# Patient Record
Sex: Male | Born: 1955
Health system: Southern US, Community
[De-identification: ages and names within clinical notes are randomized; demographics above are authoritative.]

## PROBLEM LIST (undated history)

## (undated) DIAGNOSIS — E559 Vitamin D deficiency, unspecified: Secondary | ICD-10-CM

## (undated) DIAGNOSIS — R7303 Prediabetes: Secondary | ICD-10-CM

## (undated) DIAGNOSIS — K2270 Barrett's esophagus without dysplasia: Secondary | ICD-10-CM

## (undated) DIAGNOSIS — D72819 Decreased white blood cell count, unspecified: Secondary | ICD-10-CM

## (undated) DIAGNOSIS — U071 COVID-19: Secondary | ICD-10-CM

## (undated) DIAGNOSIS — J301 Allergic rhinitis due to pollen: Secondary | ICD-10-CM

## (undated) DIAGNOSIS — M1990 Unspecified osteoarthritis, unspecified site: Secondary | ICD-10-CM

## (undated) DIAGNOSIS — K219 Gastro-esophageal reflux disease without esophagitis: Secondary | ICD-10-CM

## (undated) DIAGNOSIS — K649 Unspecified hemorrhoids: Secondary | ICD-10-CM

## (undated) DIAGNOSIS — J45909 Unspecified asthma, uncomplicated: Secondary | ICD-10-CM

## (undated) DIAGNOSIS — F439 Reaction to severe stress, unspecified: Secondary | ICD-10-CM

## (undated) DIAGNOSIS — E7800 Pure hypercholesterolemia, unspecified: Secondary | ICD-10-CM

## (undated) DIAGNOSIS — E785 Hyperlipidemia, unspecified: Secondary | ICD-10-CM

## (undated) HISTORY — DX: Unspecified osteoarthritis, unspecified site: M19.90

## (undated) HISTORY — PX: TOTAL HIP ARTHROPLASTY: SHX124

## (undated) HISTORY — DX: Hyperlipidemia, unspecified: E78.5

## (undated) HISTORY — DX: Barrett's esophagus without dysplasia: K22.70

## (undated) HISTORY — DX: Unspecified hemorrhoids: K64.9

## (undated) HISTORY — DX: Pure hypercholesterolemia, unspecified: E78.00

## (undated) HISTORY — DX: COVID-19: U07.1

## (undated) HISTORY — DX: Hypercalcemia: E83.52

## (undated) HISTORY — DX: Decreased white blood cell count, unspecified: D72.819

## (undated) HISTORY — DX: Allergic rhinitis due to pollen: J30.1

## (undated) HISTORY — DX: Unspecified asthma, uncomplicated: J45.909

## (undated) HISTORY — DX: Gastro-esophageal reflux disease without esophagitis: K21.9

## (undated) HISTORY — DX: Vitamin D deficiency, unspecified: E55.9

## (undated) HISTORY — DX: Prediabetes: R73.03

## (undated) HISTORY — DX: Reaction to severe stress, unspecified: F43.9

---

## 2003-08-04 ENCOUNTER — Ambulatory Visit (HOSPITAL_COMMUNITY): Admission: RE | Admit: 2003-08-04 | Discharge: 2003-08-04 | Payer: Self-pay | Admitting: Family Medicine

## 2003-12-16 ENCOUNTER — Ambulatory Visit (HOSPITAL_COMMUNITY): Admission: RE | Admit: 2003-12-16 | Discharge: 2003-12-16 | Payer: Self-pay | Admitting: Gastroenterology

## 2013-12-03 ENCOUNTER — Other Ambulatory Visit: Payer: Self-pay | Admitting: *Deleted

## 2013-12-03 DIAGNOSIS — R202 Paresthesia of skin: Secondary | ICD-10-CM

## 2014-01-22 ENCOUNTER — Encounter: Payer: Self-pay | Admitting: Neurology

## 2014-01-22 ENCOUNTER — Telehealth: Payer: Self-pay | Admitting: Neurology

## 2014-01-22 NOTE — Telephone Encounter (Signed)
Pt called at 11:29AM to cancel his 60mins EMG for today 01/22/14. Pt a;ready had his EMG done somewhere else. Dr. Swayne/referring provider was notified.

## 2014-01-23 NOTE — Telephone Encounter (Signed)
appt marked as a no show b/c pt did not allow 24 hours prior notification but a no show letter was not sent / Sherri S.

## 2015-01-04 DIAGNOSIS — M169 Osteoarthritis of hip, unspecified: Secondary | ICD-10-CM | POA: Insufficient documentation

## 2018-10-14 DIAGNOSIS — M1712 Unilateral primary osteoarthritis, left knee: Secondary | ICD-10-CM | POA: Insufficient documentation

## 2018-11-11 ENCOUNTER — Other Ambulatory Visit: Payer: Self-pay

## 2018-11-11 DIAGNOSIS — Z20822 Contact with and (suspected) exposure to covid-19: Secondary | ICD-10-CM

## 2018-11-13 LAB — NOVEL CORONAVIRUS, NAA: SARS-CoV-2, NAA: DETECTED — AB

## 2020-01-19 DIAGNOSIS — E559 Vitamin D deficiency, unspecified: Secondary | ICD-10-CM | POA: Diagnosis not present

## 2020-01-19 DIAGNOSIS — E782 Mixed hyperlipidemia: Secondary | ICD-10-CM | POA: Diagnosis not present

## 2020-01-19 DIAGNOSIS — Z0000 Encounter for general adult medical examination without abnormal findings: Secondary | ICD-10-CM | POA: Diagnosis not present

## 2020-01-19 DIAGNOSIS — K2270 Barrett's esophagus without dysplasia: Secondary | ICD-10-CM | POA: Diagnosis not present

## 2020-01-19 DIAGNOSIS — Z125 Encounter for screening for malignant neoplasm of prostate: Secondary | ICD-10-CM | POA: Diagnosis not present

## 2020-01-19 DIAGNOSIS — M62838 Other muscle spasm: Secondary | ICD-10-CM | POA: Diagnosis not present

## 2020-01-19 DIAGNOSIS — Z23 Encounter for immunization: Secondary | ICD-10-CM | POA: Diagnosis not present

## 2020-01-19 DIAGNOSIS — R7309 Other abnormal glucose: Secondary | ICD-10-CM | POA: Diagnosis not present

## 2020-01-19 DIAGNOSIS — M170 Bilateral primary osteoarthritis of knee: Secondary | ICD-10-CM | POA: Diagnosis not present

## 2020-01-19 DIAGNOSIS — Z136 Encounter for screening for cardiovascular disorders: Secondary | ICD-10-CM | POA: Diagnosis not present

## 2020-01-30 DIAGNOSIS — Z136 Encounter for screening for cardiovascular disorders: Secondary | ICD-10-CM | POA: Diagnosis not present

## 2020-01-30 DIAGNOSIS — Z87891 Personal history of nicotine dependence: Secondary | ICD-10-CM | POA: Diagnosis not present

## 2020-02-05 ENCOUNTER — Encounter: Payer: Self-pay | Admitting: Podiatry

## 2020-02-05 ENCOUNTER — Ambulatory Visit: Payer: Medicare HMO | Admitting: Podiatry

## 2020-02-05 ENCOUNTER — Other Ambulatory Visit: Payer: Self-pay

## 2020-02-05 DIAGNOSIS — L601 Onycholysis: Secondary | ICD-10-CM | POA: Diagnosis not present

## 2020-02-05 DIAGNOSIS — B351 Tinea unguium: Secondary | ICD-10-CM | POA: Diagnosis not present

## 2020-02-05 DIAGNOSIS — L603 Nail dystrophy: Secondary | ICD-10-CM | POA: Diagnosis not present

## 2020-02-05 DIAGNOSIS — Z1211 Encounter for screening for malignant neoplasm of colon: Secondary | ICD-10-CM | POA: Insufficient documentation

## 2020-02-06 NOTE — Progress Notes (Signed)
  Subjective:  Patient ID: Andrew Powell, male    DOB: 04/14/1955,  MRN: [id number] HPI Chief Complaint  Patient presents with  . Nail Problem    Toenails - 1st and 2nd bilateral - thick and discolored, 5th toe right - medially-dug a splinter out of toe months ago and now irritiated  . New Patient (Initial Visit)    65 y.o. male presents with the above complaint.   ROS: Denies fever chills nausea vomiting muscle aches pains calf pain back pain chest pain shortness of breath.  No past medical history on file.   Current Outpatient Medications:  .  Cholecalciferol 125 MCG (5000 UT) capsule, Take by mouth., Disp: , Rfl:  .  pantoprazole (PROTONIX) 40 MG tablet, Take 40 mg by mouth daily., Disp: , Rfl:  .  tiZANidine (ZANAFLEX) 4 MG tablet, Take 4 mg by mouth at bedtime., Disp: , Rfl:   No Known Allergies Review of Systems Objective:  There were no vitals filed for this visit.  General: Well developed, nourished, in no acute distress, alert and oriented x3   Dermatological: Skin is warm, dry and supple bilateral. Nails x 10 are well maintained; remaining integument appears unremarkable at this time. There are no open sores, no preulcerative lesions, no rash or signs of infection present.  Vascular: Dorsalis Pedis artery and Posterior Tibial artery pedal pulses are 2/4 bilateral with immedate capillary fill time. Pedal hair growth present. No varicosities and no lower extremity edema present bilateral.   Neruologic: Grossly intact via light touch bilateral. Vibratory intact via tuning fork bilateral. Protective threshold with Semmes Wienstein monofilament intact to all pedal sites bilateral. Patellar and Achilles deep tendon reflexes 2+ bilateral. No Babinski or clonus noted bilateral.   Musculoskeletal: No gross boney pedal deformities bilateral. No pain, crepitus, or limitation noted with foot and ankle range of motion bilateral. Muscular strength 5/5 in all groups tested  bilateral.  Gait: Unassisted, Nonantalgic.    Radiographs:  None taken  Assessment & Plan:   Assessment: Nail dystrophy and hammertoe deformity hallux and second digits bilaterally.  Plan: Discussed etiology pathology conservative surgical therapies at this point I took samples of the skin and nails to be sent for pathologic evaluation we will follow-up with him in 1 month     Torri Langston T. [place], [place]

## 2020-03-02 DIAGNOSIS — Z96652 Presence of left artificial knee joint: Secondary | ICD-10-CM | POA: Diagnosis not present

## 2020-03-02 DIAGNOSIS — M25462 Effusion, left knee: Secondary | ICD-10-CM | POA: Diagnosis not present

## 2020-03-03 DIAGNOSIS — Z96652 Presence of left artificial knee joint: Secondary | ICD-10-CM | POA: Diagnosis not present

## 2020-03-09 ENCOUNTER — Ambulatory Visit: Payer: Medicare HMO | Admitting: Podiatry

## 2020-03-09 DIAGNOSIS — M25462 Effusion, left knee: Secondary | ICD-10-CM | POA: Diagnosis not present

## 2020-03-11 ENCOUNTER — Ambulatory Visit: Payer: Medicare HMO | Admitting: Podiatry

## 2020-03-11 ENCOUNTER — Other Ambulatory Visit: Payer: Self-pay

## 2020-03-11 DIAGNOSIS — L603 Nail dystrophy: Secondary | ICD-10-CM

## 2020-03-11 MED ORDER — ITRACONAZOLE 100 MG PO CAPS: ORAL_CAPSULE | ORAL | 0 refills | Status: DC

## 2020-03-11 NOTE — Progress Notes (Signed)
He presents today for follow-up of his some nail fungus results.  Objective: Vital signs are stable alert oriented x3 no change in physical exam pathology report does demonstrate dermatophyte and yeast components have been isolated.  Assessment: Onychomycosis.  Plan: At this point itraconazole would be best utilized for the 93 to 100% eradication of this infection.  Associated with this we did discuss the possible side effects which he understands and is amenable to.  We are requesting a liver profile CMP and AST and ALT.  On final follow-up with him in 2 months for another AST and ALT.  Should his blood work currently come back abnormal I will notify him.

## 2020-03-11 NOTE — Patient Instructions (Signed)
Itraconazole capsules and tablets What is this medicine? ITRACONAZOLE (i tra KO na zole) is an antifungal medicine. It is used to treat certain kinds of fungal or yeast infections. This medicine may be used for other purposes; ask your health care provider or pharmacist if you have questions. COMMON BRAND NAME(S): ONMEL, Sporanox, TOLSURA What should I tell my health care provider before I take this medicine? They need to know if you have any of these conditions:  heart disease  history of irregular heartbeat  immune system problems  kidney disease  liver disease  lung or breathing disease  an unusual or allergic reaction to itraconazole, or other antifungal medicines, foods, dyes or preservatives  pregnant or trying to get pregnant  breast-feeding How should I use this medicine? Take this medicine by mouth with a full glass of water. Follow the directions on the prescription label. Take this medicine with food. Avoid taking antacids, H2-blockers, or proton pump inhibitors within 2 hours of taking this medicine. It is best to separate these medicines by 2 hours. Take your medicine at regular intervals. Do not take your medicine more often than directed. Take all of your medicine as directed even if you think you are better. Do not skip doses or stop your medicine early. Talk to your pediatrician regarding the use of this medicine in children. Special care may be needed. Overdosage: If you think you have taken too much of this medicine contact a poison control center or emergency room at once. NOTE: This medicine is only for you. Do not share this medicine with others. What if I miss a dose? If you miss a dose, take it as soon as you can. If it is almost time for your next dose, take only that dose. Do not take double or extra doses. What may interact with this medicine? Do not take this medicine with any of the following medications:  alfuzosin  alprazolam  avanafil  certain  medicines for blood pressure like felodipine, nisoldipine  certain medicines for cholesterol like cerivastatin, lovastatin, simvastatin, lomitapide  certain medicines for the heart like disopyramide, dofetilide, dronedarone, eplerenone, ivabradine, quinidine, ranolazine  cisapride  colchicine (if you have liver or kidney problems)  conivaptan  ergot alkaloids like dihydroergotamine, ergonovine, ergotamine, methylergonovine  irinotecan  isavuconazole  lurasidone  methadone  midazolam  naloxegol  nevirapine  other medicines that prolong the QT interval (cause an abnormal heart rhythm)  pimozide  red yeast rice  sirolimus  thioridazine  ticagrelor  triazolam This medicine may also interact with the following medications:  aliskiren  amlodipine  antacids  antiviral medicines for HIV or AIDS  aprepitant  atorvastatin  buprenorphine  certain antibiotics like ciprofloxacin, clarithromycin, erythromycin  certain medicines for bladder problems like fesoterodine, solifenacin, tolterodine  certain medicines for cancer like axitinib, bortezomib, busulfan, dabrafenib, dasatinib, docetaxel, erlotinib, gefitinib, ibrutinib, imatinib, ixabepilone, lapatinib, nilotinib, ponatinib, sunitinib, trabectedin, trimetrexate, vinca alkaloids  certain medicines for depression, anxiety, or psychotic disturbances like aripiprazole, buspirone, diazepam, haloperidol, perospirone, quetiapine, risperidone  certain medicines for erectile dysfunction like vardenafil, sildenafil, tadalafil  certain medicines for pain like alfentanil, fentanyl, oxycodone, sufentanil  certain medicines for stomach problems like cimetidine, famotidine, omeprazole, lansoprazole  certain medicines that treat or prevent blood clots like warfarin, dabigatran, rivaroxaban  certain medicines for seizures like carbamazepine, phenytoin  certain medicines for tuberculosis like isoniazid, INH, rifabutin,  rifampin, rifapentine  cilostazol  cinacalcet  cyclosporine  digoxin  eletriptan  everolimus  halofantrine  isradipine  meloxicam  nadolol  nifedipine  other medicines for fungal infections  praziquantel  ramelteon  repaglinide  salmeterol  saxagliptin  steroid medicines like budesonide, ciclesonide, dexamethasone, fluticasone, methylprednisolone  tacrolimus  tamsulosin  tolvaptan  verapamil  ziprasidone This list may not describe all possible interactions. Give your health care provider a list of all the medicines, herbs, non-prescription drugs, or dietary supplements you use. Also tell them if you smoke, drink alcohol, or use illegal drugs. Some items may interact with your medicine. What should I watch for while using this medicine? Tell your doctor or healthcare professional if your symptoms do not start to get better or if they get worse. You may get drowsy or dizzy. Do not drive, use machinery, or do anything that needs mental alertness until you know how the medicine affects you. Do not stand or sit up quickly, especially if you are an older patient. This reduces the risk of dizzy or fainting spells. What side effects may I notice from receiving this medicine? Side effects that you should report to your doctor or health care professional as soon as possible:  allergic reactions like skin rash, itching or hives, swelling of the face, lips, or tongue  changes in hearing  pain, tingling, numbness in the hands or feet  signs and symptoms of heart failure like breathing problems; fast, irregular heartbeat; sudden weight gain; swelling of the ankles, feet; unusually weak or tired  signs and symptoms of liver injury like dark yellow or brown urine; general ill feeling or flu-like symptoms; light-colored stools; loss of appetite; right upper belly pain; yellowing of the eyes or skin Side effects that usually do not require medical attention (report to  your doctor or health care professional if they continue or are bothersome):  blurred vision  diarrhea  dizziness  headache  nausea, vomiting This list may not describe all possible side effects. Call your doctor for medical advice about side effects. You may report side effects to FDA at 1-800-FDA-1088. Where should I keep my medicine? Keep out of the reach of children. Store at room temperature between 15 and 25 degrees C (59 and 77 degrees F). Protect from light and moisture. Throw away any unused medicine after the expiration date. NOTE: This sheet is a summary. It may not cover all possible information. If you have questions about this medicine, talk to your doctor, pharmacist, or health care provider.  2021 Elsevier/Gold Standard (2017-12-17 13:38:38)

## 2020-03-30 DIAGNOSIS — T8484XD Pain due to internal orthopedic prosthetic devices, implants and grafts, subsequent encounter: Secondary | ICD-10-CM | POA: Diagnosis not present

## 2020-03-30 DIAGNOSIS — Z96652 Presence of left artificial knee joint: Secondary | ICD-10-CM | POA: Diagnosis not present

## 2020-03-31 ENCOUNTER — Telehealth: Payer: Self-pay | Admitting: *Deleted

## 2020-03-31 NOTE — Telephone Encounter (Signed)
L/M for patient to call back regarding itraconazole prescription

## 2020-04-01 ENCOUNTER — Telehealth: Payer: Self-pay | Admitting: Podiatry

## 2020-04-01 MED ORDER — ITRACONAZOLE 100 MG PO CAPS: ORAL_CAPSULE | ORAL | 0 refills | Status: DC

## 2020-04-01 NOTE — Telephone Encounter (Signed)
Called patient -   Sent Rx to [facility] to not file with insurance for discount and GoodRx coupon

## 2020-04-01 NOTE — Telephone Encounter (Signed)
Patient has requested return call, Please Advise 

## 2020-04-01 NOTE — Addendum Note (Signed)
Addended by: Kristian Covey on: 04/01/2020 06:13 PM   Modules accepted: Orders

## 2020-04-02 ENCOUNTER — Other Ambulatory Visit: Payer: Self-pay | Admitting: Podiatry

## 2020-04-02 DIAGNOSIS — L603 Nail dystrophy: Secondary | ICD-10-CM | POA: Diagnosis not present

## 2020-04-03 LAB — COMPREHENSIVE METABOLIC PANEL
ALT: 16 IU/L (ref 0–44)
AST: 20 IU/L (ref 0–40)
Albumin/Globulin Ratio: 1 — ABNORMAL LOW (ref 1.2–2.2)
Albumin: 3.8 g/dL (ref 3.8–4.8)
Alkaline Phosphatase: 118 IU/L (ref 44–121)
BUN/Creatinine Ratio: 28 — ABNORMAL HIGH (ref 10–24)
BUN: 22 mg/dL (ref 8–27)
Bilirubin Total: 0.2 mg/dL (ref 0.0–1.2)
CO2: 24 mmol/L (ref 20–29)
Calcium: 11.3 mg/dL — ABNORMAL HIGH (ref 8.6–10.2)
Chloride: 104 mmol/L (ref 96–106)
Creatinine, Ser: 0.79 mg/dL (ref 0.76–1.27)
Globulin, Total: 3.8 g/dL (ref 1.5–4.5)
Glucose: 101 mg/dL — ABNORMAL HIGH (ref 65–99)
Potassium: 4.6 mmol/L (ref 3.5–5.2)
Sodium: 141 mmol/L (ref 134–144)
Total Protein: 7.6 g/dL (ref 6.0–8.5)
eGFR: 99 mL/min/{1.73_m2} (ref >59)

## 2020-04-05 DIAGNOSIS — Y792 Prosthetic and other implants, materials and accessory orthopedic devices associated with adverse incidents: Secondary | ICD-10-CM | POA: Diagnosis not present

## 2020-04-05 DIAGNOSIS — M25562 Pain in left knee: Secondary | ICD-10-CM | POA: Diagnosis not present

## 2020-04-05 DIAGNOSIS — M25561 Pain in right knee: Secondary | ICD-10-CM | POA: Diagnosis not present

## 2020-04-05 DIAGNOSIS — T8484XD Pain due to internal orthopedic prosthetic devices, implants and grafts, subsequent encounter: Secondary | ICD-10-CM | POA: Diagnosis not present

## 2020-04-05 DIAGNOSIS — Z96652 Presence of left artificial knee joint: Secondary | ICD-10-CM | POA: Diagnosis not present

## 2020-04-05 DIAGNOSIS — Z96643 Presence of artificial hip joint, bilateral: Secondary | ICD-10-CM | POA: Diagnosis not present

## 2020-04-07 ENCOUNTER — Telehealth: Payer: Self-pay

## 2020-04-07 NOTE — Telephone Encounter (Signed)
Patient notified of results via voice mail °

## 2020-04-07 NOTE — Telephone Encounter (Signed)
-----   Message from Elinor Parkinson, [place] sent at 04/07/2020  7:17 AM EDT ----- Blood work looks good and may continue medication.

## 2020-04-12 DIAGNOSIS — T8484XD Pain due to internal orthopedic prosthetic devices, implants and grafts, subsequent encounter: Secondary | ICD-10-CM | POA: Diagnosis not present

## 2020-04-12 DIAGNOSIS — Z96652 Presence of left artificial knee joint: Secondary | ICD-10-CM | POA: Diagnosis not present

## 2020-04-26 DIAGNOSIS — Z96652 Presence of left artificial knee joint: Secondary | ICD-10-CM | POA: Diagnosis not present

## 2020-04-26 DIAGNOSIS — T8484XD Pain due to internal orthopedic prosthetic devices, implants and grafts, subsequent encounter: Secondary | ICD-10-CM | POA: Diagnosis not present

## 2020-05-06 DIAGNOSIS — M25562 Pain in left knee: Secondary | ICD-10-CM | POA: Diagnosis not present

## 2020-05-06 DIAGNOSIS — B351 Tinea unguium: Secondary | ICD-10-CM | POA: Diagnosis not present

## 2020-05-06 DIAGNOSIS — K200 Eosinophilic esophagitis: Secondary | ICD-10-CM | POA: Diagnosis not present

## 2020-05-06 DIAGNOSIS — Z01818 Encounter for other preprocedural examination: Secondary | ICD-10-CM | POA: Diagnosis not present

## 2020-05-06 DIAGNOSIS — R0602 Shortness of breath: Secondary | ICD-10-CM | POA: Diagnosis not present

## 2020-05-06 DIAGNOSIS — Z8616 Personal history of COVID-19: Secondary | ICD-10-CM | POA: Diagnosis not present

## 2020-05-06 DIAGNOSIS — Z01810 Encounter for preprocedural cardiovascular examination: Secondary | ICD-10-CM | POA: Diagnosis not present

## 2020-05-06 DIAGNOSIS — I441 Atrioventricular block, second degree: Secondary | ICD-10-CM | POA: Diagnosis not present

## 2020-05-06 DIAGNOSIS — K219 Gastro-esophageal reflux disease without esophagitis: Secondary | ICD-10-CM | POA: Diagnosis not present

## 2020-05-06 DIAGNOSIS — K2270 Barrett's esophagus without dysplasia: Secondary | ICD-10-CM | POA: Diagnosis not present

## 2020-05-06 DIAGNOSIS — Z01812 Encounter for preprocedural laboratory examination: Secondary | ICD-10-CM | POA: Diagnosis not present

## 2020-05-06 DIAGNOSIS — T8454XS Infection and inflammatory reaction due to internal left knee prosthesis, sequela: Secondary | ICD-10-CM | POA: Diagnosis not present

## 2020-05-06 DIAGNOSIS — Z87891 Personal history of nicotine dependence: Secondary | ICD-10-CM | POA: Diagnosis not present

## 2020-05-06 DIAGNOSIS — M1712 Unilateral primary osteoarthritis, left knee: Secondary | ICD-10-CM | POA: Diagnosis not present

## 2020-05-10 ENCOUNTER — Other Ambulatory Visit: Payer: Self-pay

## 2020-05-10 ENCOUNTER — Emergency Department (HOSPITAL_COMMUNITY)
Admission: EM | Admit: 2020-05-10 | Discharge: 2020-05-11 | Disposition: A | Discharge status: Home / Self Care | Payer: Medicare HMO | Attending: Emergency Medicine | Admitting: Emergency Medicine

## 2020-05-10 DIAGNOSIS — R600 Localized edema: Secondary | ICD-10-CM | POA: Insufficient documentation

## 2020-05-10 DIAGNOSIS — M79605 Pain in left leg: Secondary | ICD-10-CM | POA: Diagnosis present

## 2020-05-10 DIAGNOSIS — M79662 Pain in left lower leg: Secondary | ICD-10-CM | POA: Diagnosis not present

## 2020-05-10 LAB — COMPREHENSIVE METABOLIC PANEL
ALT: 13 U/L (ref 0–44)
AST: 15 U/L (ref 15–41)
Albumin: 3.5 g/dL (ref 3.5–5.0)
Alkaline Phosphatase: 85 U/L (ref 38–126)
Anion gap: 5 (ref 5–15)
BUN: 15 mg/dL (ref 8–23)
CO2: 25 mmol/L (ref 22–32)
Calcium: 10.4 mg/dL — ABNORMAL HIGH (ref 8.9–10.3)
Chloride: 108 mmol/L (ref 98–111)
Creatinine, Ser: 0.75 mg/dL (ref 0.61–1.24)
GFR, Estimated: >60 mL/min (ref >60)
Glucose, Bld: 105 mg/dL — ABNORMAL HIGH (ref 70–99)
Potassium: 4.4 mmol/L (ref 3.5–5.1)
Sodium: 138 mmol/L (ref 135–145)
Total Bilirubin: 0.4 mg/dL (ref 0.3–1.2)
Total Protein: 7.6 g/dL (ref 6.5–8.1)

## 2020-05-10 LAB — CBC WITH DIFFERENTIAL/PLATELET
Abs Immature Granulocytes: 0.01 10*3/uL (ref 0.00–0.07)
Basophils Absolute: 0 10*3/uL (ref 0.0–0.1)
Basophils Relative: 1 %
Eosinophils Absolute: 0.4 10*3/uL (ref 0.0–0.5)
Eosinophils Relative: 8 %
HCT: 38.9 % — ABNORMAL LOW (ref 39.0–52.0)
Hemoglobin: 12 g/dL — ABNORMAL LOW (ref 13.0–17.0)
Immature Granulocytes: 0 %
Lymphocytes Relative: 24 %
Lymphs Abs: 1.2 10*3/uL (ref 0.7–4.0)
MCH: 26.3 pg (ref 26.0–34.0)
MCHC: 30.8 g/dL (ref 30.0–36.0)
MCV: 85.1 fL (ref 80.0–100.0)
Monocytes Absolute: 0.5 10*3/uL (ref 0.1–1.0)
Monocytes Relative: 9 %
Neutro Abs: 2.8 10*3/uL (ref 1.7–7.7)
Neutrophils Relative %: 58 %
Platelets: 261 10*3/uL (ref 150–400)
RBC: 4.57 MIL/uL (ref 4.22–5.81)
RDW: 14.3 % (ref 11.5–15.5)
WBC: 4.9 10*3/uL (ref 4.0–10.5)
nRBC: 0 % (ref 0.0–0.2)

## 2020-05-10 LAB — D-DIMER, QUANTITATIVE: D-Dimer, Quant: 2.31 ug/mL-FEU — ABNORMAL HIGH (ref 0.00–0.50)

## 2020-05-10 NOTE — ED Triage Notes (Signed)
Pt c/o L lower leg pain and swelling starting on Friday, states today he noticed redness to calf area and increased pain with ambulation. Was seen by ortho and sent here to rule out DVT. Pt is scheduled to have L knee replacement taken out next week due to infection but not currently on abx

## 2020-05-10 NOTE — ED Triage Notes (Signed)
Emergency Medicine Provider Triage Evaluation Note  Andrew Powell , a 65 y.o. male  was evaluated in triage.  Pt complains of left lower leg pain, swelling, redness. Reports left leg with cramping Friday, progressively worsening. Scheduled for removal of left knee replacement next week for infection, not currently on antibiotics.   Review of Systems  Positive: Leg pain, swelling, redness Negative: SHOB, fever  Physical Exam  BP 138/88 (BP Location: Left Arm)   Pulse 60   Temp 98.3 F (36.8 C) (Oral)   Resp 16   Ht 6' (1.829 m)   Wt 93.9 kg   SpO2 99%   BMI 28.07 kg/m  Gen:   Awake, no distress   HEENT:  Atraumatic  Resp:  Normal effort  Cardiac:  Normal rate  Abd:   Nondistended, nontender  MSK:   Moves extremities without difficulty, swelling, mild erythema to left lower leg, DP pulse present.  Neuro:  Speech clear   Medical Decision Making  Medically screening exam initiated at 9:47 PM.  Appropriate orders placed.  Quay Burow was informed that the remainder of the evaluation will be completed by another provider, this initial triage assessment does not replace that evaluation, and the importance of remaining in the ED until their evaluation is complete.  Clinical Impression  Doppler not available, pt requests dimer with labs prior to Lovenox decision.    Jeannie Fend, PA-C 05/10/20 2149

## 2020-05-11 ENCOUNTER — Ambulatory Visit (HOSPITAL_BASED_OUTPATIENT_CLINIC_OR_DEPARTMENT_OTHER)
Admission: RE | Admit: 2020-05-11 | Discharge: 2020-05-11 | Disposition: A | Discharge status: Home / Self Care | Payer: Medicare HMO | Source: Ambulatory Visit | Attending: Emergency Medicine | Admitting: Emergency Medicine

## 2020-05-11 DIAGNOSIS — M79605 Pain in left leg: Secondary | ICD-10-CM | POA: Diagnosis not present

## 2020-05-11 DIAGNOSIS — L538 Other specified erythematous conditions: Secondary | ICD-10-CM | POA: Diagnosis not present

## 2020-05-11 DIAGNOSIS — R0602 Shortness of breath: Secondary | ICD-10-CM | POA: Diagnosis not present

## 2020-05-11 DIAGNOSIS — M7989 Other specified soft tissue disorders: Secondary | ICD-10-CM

## 2020-05-11 DIAGNOSIS — I441 Atrioventricular block, second degree: Secondary | ICD-10-CM | POA: Diagnosis not present

## 2020-05-11 DIAGNOSIS — M79606 Pain in leg, unspecified: Secondary | ICD-10-CM | POA: Insufficient documentation

## 2020-05-11 DIAGNOSIS — Z96652 Presence of left artificial knee joint: Secondary | ICD-10-CM | POA: Insufficient documentation

## 2020-05-11 DIAGNOSIS — L539 Erythematous condition, unspecified: Secondary | ICD-10-CM | POA: Insufficient documentation

## 2020-05-11 MED ORDER — ENOXAPARIN SODIUM 100 MG/ML IJ SOSY
1.0000 mg/kg | PREFILLED_SYRINGE | Freq: Once | INTRAMUSCULAR | Status: AC
  Administered 2020-05-11: 95 mg via SUBCUTANEOUS
  Filled 2020-05-11: qty 0.95

## 2020-05-11 NOTE — ED Provider Notes (Signed)
[facility]-EMERGENCY DEPT Provider Note   CSN: [id number] Arrival date & time: 05/10/20  2043     History Chief Complaint  Patient presents with  . Leg Pain    Andrew Powell is a 65 y.o. male.  The history is provided by the patient and medical records.  Leg Pain  Andrew Powell is a 65 y.o. male who presents to the Emergency Department complaining of leg swelling. He presents the emergency department complaining of left lower leg swelling, pain discomfort and redness that started four days ago. He is scheduled to have surgery just over a week from now for an antibiotic in his knee. No reports of fevers, chest pain, shortness of breath. He is not currently on antibiotics. No history of DVT. He is not on any blood thinners.    No past medical history on file.  Patient Active Problem List   Diagnosis Date Noted  . Encounter for screening colonoscopy 02/05/2020  . Primary osteoarthritis of left knee 10/14/2018  . OA (osteoarthritis) of hip 01/04/2015         No family history on file.  Social History   Tobacco Use  . Smoking status: Never Smoker  . Smokeless tobacco: Never Used    Home Medications Prior to Admission medications   Medication Sig Start Date End Date Taking? Authorizing Provider  Cholecalciferol 125 MCG (5000 UT) capsule Take by mouth.    [provider]  itraconazole (SPORANOX) 100 MG capsule Take one capsule BID x 7 days, then stop x 21 days. Repeat round (3 month supply) 04/01/20   Hyatt, Max T, DPM  pantoprazole (PROTONIX) 40 MG tablet Take 40 mg by mouth daily. 12/14/19   [provider]  tiZANidine (ZANAFLEX) 4 MG tablet Take 4 mg by mouth at bedtime. 01/19/20   [provider]    Allergies    Patient has no known allergies.  Review of Systems   Review of Systems  All other systems reviewed and are negative.   Physical Exam Updated Vital Signs BP 137/78 (BP Location: Left Arm)   Pulse (!)  51   Temp 97.7 F (36.5 C) (Oral)   Resp 17   Ht 6' (1.829 m)   Wt 93.9 kg   SpO2 100%   BMI 28.07 kg/m   Physical Exam Vitals and nursing note reviewed.  Constitutional:      Appearance: He is well-developed.  HENT:     Head: Normocephalic and atraumatic.  Cardiovascular:     Rate and Rhythm: Normal rate and regular rhythm.  Pulmonary:     Effort: Pulmonary effort is normal. No respiratory distress.  Musculoskeletal:        General: No tenderness.     Comments: 2+ DP pulses bilaterally. There is moderate edema to the left calf with mild overlying erythema.  Skin:    General: Skin is warm and dry.  Neurological:     Mental Status: He is alert and oriented to person, place, and time.  Psychiatric:        Behavior: Behavior normal.     ED Results / Procedures / Treatments   Labs (all labs ordered are listed, but only abnormal results are displayed) Labs Reviewed  COMPREHENSIVE METABOLIC PANEL - Abnormal; Notable for the following components:      Result Value   Glucose, Bld 105 (*)    Calcium 10.4 (*)    All other components within normal limits  CBC WITH DIFFERENTIAL/PLATELET - Abnormal;  Notable for the following components:   Hemoglobin 12.0 (*)    HCT 38.9 (*)    All other components within normal limits  D-DIMER, QUANTITATIVE - Abnormal; Notable for the following components:   D-Dimer, Quant 2.31 (*)    All other components within normal limits    EKG None  Radiology No results found.  Procedures Procedures   Medications Ordered in ED Medications  enoxaparin (LOVENOX) injection 95 mg (has no administration in time range)    ED Course  I have reviewed the triage vital signs and the nursing notes.  Pertinent labs & imaging results that were available during my care of the patient were reviewed by me and considered in my medical decision making (see chart for details).    MDM Rules/Calculators/A&P                         patient with history of  chronic knee infection here for evaluation of leg swelling and discomfort. He does have edema in mild erythema to the area, no systemic symptoms. Concern for DVT. Vascular ultrasound is not available at this time. He has no significant erythema to the knee joint itself. Will treat with one-time dose of Lovenox with plan for vascular ultrasound later today when it is available. Discussed with patient importance of obtaining this study as well as return precautions. If vascular ultrasound is negative it would consider initiating oral antibiotics.   Final Clinical Impression(s) / ED Diagnoses Final diagnoses:  Left leg pain    Rx / DC Orders ED Discharge Orders         Ordered    LE VENOUS        05/11/20 0209           Tilden Fossa, MD 05/11/20 [phone]

## 2020-05-11 NOTE — Discharge Instructions (Addendum)
Please follow up for further imaging of your leg to rule out DVT. Get rechecked immediately if you develop fevers, chest pain or difficulty breathing.

## 2020-05-14 DIAGNOSIS — T8182XA Emphysema (subcutaneous) resulting from a procedure, initial encounter: Secondary | ICD-10-CM | POA: Diagnosis not present

## 2020-05-14 DIAGNOSIS — K219 Gastro-esophageal reflux disease without esophagitis: Secondary | ICD-10-CM | POA: Diagnosis not present

## 2020-05-14 DIAGNOSIS — Z87891 Personal history of nicotine dependence: Secondary | ICD-10-CM | POA: Diagnosis not present

## 2020-05-14 DIAGNOSIS — Z96652 Presence of left artificial knee joint: Secondary | ICD-10-CM | POA: Diagnosis not present

## 2020-05-14 DIAGNOSIS — M7989 Other specified soft tissue disorders: Secondary | ICD-10-CM | POA: Diagnosis not present

## 2020-05-14 DIAGNOSIS — M1611 Unilateral primary osteoarthritis, right hip: Secondary | ICD-10-CM | POA: Diagnosis not present

## 2020-05-14 DIAGNOSIS — T8454XA Infection and inflammatory reaction due to internal left knee prosthesis, initial encounter: Secondary | ICD-10-CM | POA: Diagnosis not present

## 2020-05-14 DIAGNOSIS — T8454XD Infection and inflammatory reaction due to internal left knee prosthesis, subsequent encounter: Secondary | ICD-10-CM | POA: Diagnosis not present

## 2020-05-14 DIAGNOSIS — Z89522 Acquired absence of left knee: Secondary | ICD-10-CM | POA: Diagnosis not present

## 2020-05-14 DIAGNOSIS — Y838 Other surgical procedures as the cause of abnormal reaction of the patient, or of later complication, without mention of misadventure at the time of the procedure: Secondary | ICD-10-CM | POA: Diagnosis not present

## 2020-05-14 DIAGNOSIS — Z96643 Presence of artificial hip joint, bilateral: Secondary | ICD-10-CM | POA: Diagnosis not present

## 2020-05-14 DIAGNOSIS — I441 Atrioventricular block, second degree: Secondary | ICD-10-CM | POA: Diagnosis not present

## 2020-05-19 DIAGNOSIS — A4189 Other specified sepsis: Secondary | ICD-10-CM | POA: Diagnosis not present

## 2020-05-19 DIAGNOSIS — T8454XA Infection and inflammatory reaction due to internal left knee prosthesis, initial encounter: Secondary | ICD-10-CM | POA: Diagnosis not present

## 2020-05-20 DIAGNOSIS — A4189 Other specified sepsis: Secondary | ICD-10-CM | POA: Diagnosis not present

## 2020-05-21 DIAGNOSIS — K2270 Barrett's esophagus without dysplasia: Secondary | ICD-10-CM | POA: Diagnosis not present

## 2020-05-21 DIAGNOSIS — Z4789 Encounter for other orthopedic aftercare: Secondary | ICD-10-CM | POA: Diagnosis not present

## 2020-05-21 DIAGNOSIS — B351 Tinea unguium: Secondary | ICD-10-CM | POA: Diagnosis not present

## 2020-05-21 DIAGNOSIS — K222 Esophageal obstruction: Secondary | ICD-10-CM | POA: Diagnosis not present

## 2020-05-21 DIAGNOSIS — T8454XD Infection and inflammatory reaction due to internal left knee prosthesis, subsequent encounter: Secondary | ICD-10-CM | POA: Diagnosis not present

## 2020-05-21 DIAGNOSIS — D649 Anemia, unspecified: Secondary | ICD-10-CM | POA: Diagnosis not present

## 2020-05-21 DIAGNOSIS — I441 Atrioventricular block, second degree: Secondary | ICD-10-CM | POA: Diagnosis not present

## 2020-05-21 DIAGNOSIS — K219 Gastro-esophageal reflux disease without esophagitis: Secondary | ICD-10-CM | POA: Diagnosis not present

## 2020-05-21 DIAGNOSIS — Z87891 Personal history of nicotine dependence: Secondary | ICD-10-CM | POA: Diagnosis not present

## 2020-05-21 DIAGNOSIS — Z8616 Personal history of COVID-19: Secondary | ICD-10-CM | POA: Diagnosis not present

## 2020-05-24 DIAGNOSIS — A4189 Other specified sepsis: Secondary | ICD-10-CM | POA: Diagnosis not present

## 2020-05-24 DIAGNOSIS — T8454XA Infection and inflammatory reaction due to internal left knee prosthesis, initial encounter: Secondary | ICD-10-CM | POA: Diagnosis not present

## 2020-05-25 DIAGNOSIS — D649 Anemia, unspecified: Secondary | ICD-10-CM | POA: Diagnosis not present

## 2020-05-25 DIAGNOSIS — K222 Esophageal obstruction: Secondary | ICD-10-CM | POA: Diagnosis not present

## 2020-05-25 DIAGNOSIS — Z87891 Personal history of nicotine dependence: Secondary | ICD-10-CM | POA: Diagnosis not present

## 2020-05-25 DIAGNOSIS — I441 Atrioventricular block, second degree: Secondary | ICD-10-CM | POA: Diagnosis not present

## 2020-05-25 DIAGNOSIS — K2270 Barrett's esophagus without dysplasia: Secondary | ICD-10-CM | POA: Diagnosis not present

## 2020-05-25 DIAGNOSIS — T8454XD Infection and inflammatory reaction due to internal left knee prosthesis, subsequent encounter: Secondary | ICD-10-CM | POA: Diagnosis not present

## 2020-05-25 DIAGNOSIS — B351 Tinea unguium: Secondary | ICD-10-CM | POA: Diagnosis not present

## 2020-05-25 DIAGNOSIS — Z8616 Personal history of COVID-19: Secondary | ICD-10-CM | POA: Diagnosis not present

## 2020-05-25 DIAGNOSIS — K219 Gastro-esophageal reflux disease without esophagitis: Secondary | ICD-10-CM | POA: Diagnosis not present

## 2020-05-27 DIAGNOSIS — T8454XD Infection and inflammatory reaction due to internal left knee prosthesis, subsequent encounter: Secondary | ICD-10-CM | POA: Diagnosis not present

## 2020-05-27 DIAGNOSIS — M1712 Unilateral primary osteoarthritis, left knee: Secondary | ICD-10-CM | POA: Diagnosis not present

## 2020-05-27 DIAGNOSIS — A4189 Other specified sepsis: Secondary | ICD-10-CM | POA: Diagnosis not present

## 2020-05-27 DIAGNOSIS — T8454XA Infection and inflammatory reaction due to internal left knee prosthesis, initial encounter: Secondary | ICD-10-CM | POA: Diagnosis not present

## 2020-05-28 DIAGNOSIS — K2270 Barrett's esophagus without dysplasia: Secondary | ICD-10-CM | POA: Diagnosis not present

## 2020-05-28 DIAGNOSIS — B351 Tinea unguium: Secondary | ICD-10-CM | POA: Diagnosis not present

## 2020-05-28 DIAGNOSIS — Z87891 Personal history of nicotine dependence: Secondary | ICD-10-CM | POA: Diagnosis not present

## 2020-05-28 DIAGNOSIS — D649 Anemia, unspecified: Secondary | ICD-10-CM | POA: Diagnosis not present

## 2020-05-28 DIAGNOSIS — Z8616 Personal history of COVID-19: Secondary | ICD-10-CM | POA: Diagnosis not present

## 2020-05-28 DIAGNOSIS — K222 Esophageal obstruction: Secondary | ICD-10-CM | POA: Diagnosis not present

## 2020-05-28 DIAGNOSIS — K219 Gastro-esophageal reflux disease without esophagitis: Secondary | ICD-10-CM | POA: Diagnosis not present

## 2020-05-28 DIAGNOSIS — I441 Atrioventricular block, second degree: Secondary | ICD-10-CM | POA: Diagnosis not present

## 2020-05-28 DIAGNOSIS — T8454XD Infection and inflammatory reaction due to internal left knee prosthesis, subsequent encounter: Secondary | ICD-10-CM | POA: Diagnosis not present

## 2020-05-29 DIAGNOSIS — A4189 Other specified sepsis: Secondary | ICD-10-CM | POA: Diagnosis not present

## 2020-05-31 DIAGNOSIS — K2270 Barrett's esophagus without dysplasia: Secondary | ICD-10-CM | POA: Diagnosis not present

## 2020-05-31 DIAGNOSIS — T8454XD Infection and inflammatory reaction due to internal left knee prosthesis, subsequent encounter: Secondary | ICD-10-CM | POA: Diagnosis not present

## 2020-05-31 DIAGNOSIS — I441 Atrioventricular block, second degree: Secondary | ICD-10-CM | POA: Diagnosis not present

## 2020-05-31 DIAGNOSIS — Z87891 Personal history of nicotine dependence: Secondary | ICD-10-CM | POA: Diagnosis not present

## 2020-05-31 DIAGNOSIS — B351 Tinea unguium: Secondary | ICD-10-CM | POA: Diagnosis not present

## 2020-05-31 DIAGNOSIS — Z8616 Personal history of COVID-19: Secondary | ICD-10-CM | POA: Diagnosis not present

## 2020-05-31 DIAGNOSIS — K219 Gastro-esophageal reflux disease without esophagitis: Secondary | ICD-10-CM | POA: Diagnosis not present

## 2020-05-31 DIAGNOSIS — D649 Anemia, unspecified: Secondary | ICD-10-CM | POA: Diagnosis not present

## 2020-05-31 DIAGNOSIS — K222 Esophageal obstruction: Secondary | ICD-10-CM | POA: Diagnosis not present

## 2020-06-01 DIAGNOSIS — A4189 Other specified sepsis: Secondary | ICD-10-CM | POA: Diagnosis not present

## 2020-06-01 DIAGNOSIS — T8454XA Infection and inflammatory reaction due to internal left knee prosthesis, initial encounter: Secondary | ICD-10-CM | POA: Diagnosis not present

## 2020-06-02 DIAGNOSIS — K2270 Barrett's esophagus without dysplasia: Secondary | ICD-10-CM | POA: Diagnosis not present

## 2020-06-02 DIAGNOSIS — B351 Tinea unguium: Secondary | ICD-10-CM | POA: Diagnosis not present

## 2020-06-02 DIAGNOSIS — Z8616 Personal history of COVID-19: Secondary | ICD-10-CM | POA: Diagnosis not present

## 2020-06-02 DIAGNOSIS — T8454XD Infection and inflammatory reaction due to internal left knee prosthesis, subsequent encounter: Secondary | ICD-10-CM | POA: Diagnosis not present

## 2020-06-02 DIAGNOSIS — K222 Esophageal obstruction: Secondary | ICD-10-CM | POA: Diagnosis not present

## 2020-06-02 DIAGNOSIS — D649 Anemia, unspecified: Secondary | ICD-10-CM | POA: Diagnosis not present

## 2020-06-02 DIAGNOSIS — I441 Atrioventricular block, second degree: Secondary | ICD-10-CM | POA: Diagnosis not present

## 2020-06-02 DIAGNOSIS — Z87891 Personal history of nicotine dependence: Secondary | ICD-10-CM | POA: Diagnosis not present

## 2020-06-02 DIAGNOSIS — K219 Gastro-esophageal reflux disease without esophagitis: Secondary | ICD-10-CM | POA: Diagnosis not present

## 2020-06-03 DIAGNOSIS — T8454XA Infection and inflammatory reaction due to internal left knee prosthesis, initial encounter: Secondary | ICD-10-CM | POA: Diagnosis not present

## 2020-06-03 DIAGNOSIS — A4189 Other specified sepsis: Secondary | ICD-10-CM | POA: Diagnosis not present

## 2020-06-05 DIAGNOSIS — A4189 Other specified sepsis: Secondary | ICD-10-CM | POA: Diagnosis not present

## 2020-06-07 DIAGNOSIS — A4189 Other specified sepsis: Secondary | ICD-10-CM | POA: Diagnosis not present

## 2020-06-07 DIAGNOSIS — Y831 Surgical operation with implant of artificial internal device as the cause of abnormal reaction of the patient, or of later complication, without mention of misadventure at the time of the procedure: Secondary | ICD-10-CM | POA: Diagnosis not present

## 2020-06-07 DIAGNOSIS — T8454XA Infection and inflammatory reaction due to internal left knee prosthesis, initial encounter: Secondary | ICD-10-CM | POA: Diagnosis not present

## 2020-06-08 DIAGNOSIS — T8454XD Infection and inflammatory reaction due to internal left knee prosthesis, subsequent encounter: Secondary | ICD-10-CM | POA: Diagnosis not present

## 2020-06-08 DIAGNOSIS — D649 Anemia, unspecified: Secondary | ICD-10-CM | POA: Diagnosis not present

## 2020-06-08 DIAGNOSIS — K2270 Barrett's esophagus without dysplasia: Secondary | ICD-10-CM | POA: Diagnosis not present

## 2020-06-08 DIAGNOSIS — Z8616 Personal history of COVID-19: Secondary | ICD-10-CM | POA: Diagnosis not present

## 2020-06-08 DIAGNOSIS — Z87891 Personal history of nicotine dependence: Secondary | ICD-10-CM | POA: Diagnosis not present

## 2020-06-08 DIAGNOSIS — K222 Esophageal obstruction: Secondary | ICD-10-CM | POA: Diagnosis not present

## 2020-06-08 DIAGNOSIS — K219 Gastro-esophageal reflux disease without esophagitis: Secondary | ICD-10-CM | POA: Diagnosis not present

## 2020-06-08 DIAGNOSIS — B351 Tinea unguium: Secondary | ICD-10-CM | POA: Diagnosis not present

## 2020-06-08 DIAGNOSIS — I441 Atrioventricular block, second degree: Secondary | ICD-10-CM | POA: Diagnosis not present

## 2020-06-10 DIAGNOSIS — I441 Atrioventricular block, second degree: Secondary | ICD-10-CM | POA: Diagnosis not present

## 2020-06-10 DIAGNOSIS — D649 Anemia, unspecified: Secondary | ICD-10-CM | POA: Diagnosis not present

## 2020-06-10 DIAGNOSIS — Z8616 Personal history of COVID-19: Secondary | ICD-10-CM | POA: Diagnosis not present

## 2020-06-10 DIAGNOSIS — T8454XA Infection and inflammatory reaction due to internal left knee prosthesis, initial encounter: Secondary | ICD-10-CM | POA: Diagnosis not present

## 2020-06-10 DIAGNOSIS — K219 Gastro-esophageal reflux disease without esophagitis: Secondary | ICD-10-CM | POA: Diagnosis not present

## 2020-06-10 DIAGNOSIS — Z87891 Personal history of nicotine dependence: Secondary | ICD-10-CM | POA: Diagnosis not present

## 2020-06-10 DIAGNOSIS — T8454XD Infection and inflammatory reaction due to internal left knee prosthesis, subsequent encounter: Secondary | ICD-10-CM | POA: Diagnosis not present

## 2020-06-10 DIAGNOSIS — B351 Tinea unguium: Secondary | ICD-10-CM | POA: Diagnosis not present

## 2020-06-10 DIAGNOSIS — K2270 Barrett's esophagus without dysplasia: Secondary | ICD-10-CM | POA: Diagnosis not present

## 2020-06-10 DIAGNOSIS — K222 Esophageal obstruction: Secondary | ICD-10-CM | POA: Diagnosis not present

## 2020-06-10 DIAGNOSIS — A4189 Other specified sepsis: Secondary | ICD-10-CM | POA: Diagnosis not present

## 2020-06-11 DIAGNOSIS — T8454XA Infection and inflammatory reaction due to internal left knee prosthesis, initial encounter: Secondary | ICD-10-CM | POA: Diagnosis not present

## 2020-06-11 DIAGNOSIS — A4189 Other specified sepsis: Secondary | ICD-10-CM | POA: Diagnosis not present

## 2020-06-13 DIAGNOSIS — A4189 Other specified sepsis: Secondary | ICD-10-CM | POA: Diagnosis not present

## 2020-06-14 DIAGNOSIS — K222 Esophageal obstruction: Secondary | ICD-10-CM | POA: Diagnosis not present

## 2020-06-14 DIAGNOSIS — Z8616 Personal history of COVID-19: Secondary | ICD-10-CM | POA: Diagnosis not present

## 2020-06-14 DIAGNOSIS — T8454XA Infection and inflammatory reaction due to internal left knee prosthesis, initial encounter: Secondary | ICD-10-CM | POA: Diagnosis not present

## 2020-06-14 DIAGNOSIS — B351 Tinea unguium: Secondary | ICD-10-CM | POA: Diagnosis not present

## 2020-06-14 DIAGNOSIS — T8454XD Infection and inflammatory reaction due to internal left knee prosthesis, subsequent encounter: Secondary | ICD-10-CM | POA: Diagnosis not present

## 2020-06-14 DIAGNOSIS — Z87891 Personal history of nicotine dependence: Secondary | ICD-10-CM | POA: Diagnosis not present

## 2020-06-14 DIAGNOSIS — D649 Anemia, unspecified: Secondary | ICD-10-CM | POA: Diagnosis not present

## 2020-06-14 DIAGNOSIS — I441 Atrioventricular block, second degree: Secondary | ICD-10-CM | POA: Diagnosis not present

## 2020-06-14 DIAGNOSIS — K219 Gastro-esophageal reflux disease without esophagitis: Secondary | ICD-10-CM | POA: Diagnosis not present

## 2020-06-14 DIAGNOSIS — K2270 Barrett's esophagus without dysplasia: Secondary | ICD-10-CM | POA: Diagnosis not present

## 2020-06-14 DIAGNOSIS — A4189 Other specified sepsis: Secondary | ICD-10-CM | POA: Diagnosis not present

## 2020-06-16 DIAGNOSIS — Z8616 Personal history of COVID-19: Secondary | ICD-10-CM | POA: Diagnosis not present

## 2020-06-16 DIAGNOSIS — K219 Gastro-esophageal reflux disease without esophagitis: Secondary | ICD-10-CM | POA: Diagnosis not present

## 2020-06-16 DIAGNOSIS — I441 Atrioventricular block, second degree: Secondary | ICD-10-CM | POA: Diagnosis not present

## 2020-06-16 DIAGNOSIS — K222 Esophageal obstruction: Secondary | ICD-10-CM | POA: Diagnosis not present

## 2020-06-16 DIAGNOSIS — T8454XD Infection and inflammatory reaction due to internal left knee prosthesis, subsequent encounter: Secondary | ICD-10-CM | POA: Diagnosis not present

## 2020-06-16 DIAGNOSIS — Z87891 Personal history of nicotine dependence: Secondary | ICD-10-CM | POA: Diagnosis not present

## 2020-06-16 DIAGNOSIS — B351 Tinea unguium: Secondary | ICD-10-CM | POA: Diagnosis not present

## 2020-06-16 DIAGNOSIS — K2270 Barrett's esophagus without dysplasia: Secondary | ICD-10-CM | POA: Diagnosis not present

## 2020-06-16 DIAGNOSIS — D649 Anemia, unspecified: Secondary | ICD-10-CM | POA: Diagnosis not present

## 2020-06-17 DIAGNOSIS — T8454XA Infection and inflammatory reaction due to internal left knee prosthesis, initial encounter: Secondary | ICD-10-CM | POA: Diagnosis not present

## 2020-06-17 DIAGNOSIS — A4189 Other specified sepsis: Secondary | ICD-10-CM | POA: Diagnosis not present

## 2020-06-19 DIAGNOSIS — A4189 Other specified sepsis: Secondary | ICD-10-CM | POA: Diagnosis not present

## 2020-06-21 DIAGNOSIS — T8454XD Infection and inflammatory reaction due to internal left knee prosthesis, subsequent encounter: Secondary | ICD-10-CM | POA: Diagnosis not present

## 2020-06-21 DIAGNOSIS — A4189 Other specified sepsis: Secondary | ICD-10-CM | POA: Diagnosis not present

## 2020-06-21 DIAGNOSIS — Z8616 Personal history of COVID-19: Secondary | ICD-10-CM | POA: Diagnosis not present

## 2020-06-21 DIAGNOSIS — B351 Tinea unguium: Secondary | ICD-10-CM | POA: Diagnosis not present

## 2020-06-21 DIAGNOSIS — K222 Esophageal obstruction: Secondary | ICD-10-CM | POA: Diagnosis not present

## 2020-06-21 DIAGNOSIS — I441 Atrioventricular block, second degree: Secondary | ICD-10-CM | POA: Diagnosis not present

## 2020-06-21 DIAGNOSIS — K219 Gastro-esophageal reflux disease without esophagitis: Secondary | ICD-10-CM | POA: Diagnosis not present

## 2020-06-21 DIAGNOSIS — T8454XA Infection and inflammatory reaction due to internal left knee prosthesis, initial encounter: Secondary | ICD-10-CM | POA: Diagnosis not present

## 2020-06-21 DIAGNOSIS — Z87891 Personal history of nicotine dependence: Secondary | ICD-10-CM | POA: Diagnosis not present

## 2020-06-21 DIAGNOSIS — K2270 Barrett's esophagus without dysplasia: Secondary | ICD-10-CM | POA: Diagnosis not present

## 2020-06-21 DIAGNOSIS — D649 Anemia, unspecified: Secondary | ICD-10-CM | POA: Diagnosis not present

## 2020-06-24 DIAGNOSIS — T8454XD Infection and inflammatory reaction due to internal left knee prosthesis, subsequent encounter: Secondary | ICD-10-CM | POA: Diagnosis not present

## 2020-06-24 DIAGNOSIS — K2270 Barrett's esophagus without dysplasia: Secondary | ICD-10-CM | POA: Diagnosis not present

## 2020-06-24 DIAGNOSIS — T8459XA Infection and inflammatory reaction due to other internal joint prosthesis, initial encounter: Secondary | ICD-10-CM | POA: Diagnosis not present

## 2020-06-24 DIAGNOSIS — Z87891 Personal history of nicotine dependence: Secondary | ICD-10-CM | POA: Diagnosis not present

## 2020-06-24 DIAGNOSIS — D649 Anemia, unspecified: Secondary | ICD-10-CM | POA: Diagnosis not present

## 2020-06-24 DIAGNOSIS — B351 Tinea unguium: Secondary | ICD-10-CM | POA: Diagnosis not present

## 2020-06-24 DIAGNOSIS — A4189 Other specified sepsis: Secondary | ICD-10-CM | POA: Diagnosis not present

## 2020-06-24 DIAGNOSIS — M1712 Unilateral primary osteoarthritis, left knee: Secondary | ICD-10-CM | POA: Diagnosis not present

## 2020-06-24 DIAGNOSIS — D72819 Decreased white blood cell count, unspecified: Secondary | ICD-10-CM | POA: Diagnosis not present

## 2020-06-24 DIAGNOSIS — T8454XA Infection and inflammatory reaction due to internal left knee prosthesis, initial encounter: Secondary | ICD-10-CM | POA: Diagnosis not present

## 2020-06-24 DIAGNOSIS — K219 Gastro-esophageal reflux disease without esophagitis: Secondary | ICD-10-CM | POA: Diagnosis not present

## 2020-06-24 DIAGNOSIS — Z8616 Personal history of COVID-19: Secondary | ICD-10-CM | POA: Diagnosis not present

## 2020-06-24 DIAGNOSIS — K222 Esophageal obstruction: Secondary | ICD-10-CM | POA: Diagnosis not present

## 2020-06-24 DIAGNOSIS — I441 Atrioventricular block, second degree: Secondary | ICD-10-CM | POA: Diagnosis not present

## 2020-06-24 DIAGNOSIS — Z96659 Presence of unspecified artificial knee joint: Secondary | ICD-10-CM | POA: Diagnosis not present

## 2020-07-09 DIAGNOSIS — X58XXXD Exposure to other specified factors, subsequent encounter: Secondary | ICD-10-CM | POA: Diagnosis not present

## 2020-07-09 DIAGNOSIS — Z8616 Personal history of COVID-19: Secondary | ICD-10-CM | POA: Diagnosis not present

## 2020-07-09 DIAGNOSIS — K2270 Barrett's esophagus without dysplasia: Secondary | ICD-10-CM | POA: Diagnosis not present

## 2020-07-09 DIAGNOSIS — T8454XD Infection and inflammatory reaction due to internal left knee prosthesis, subsequent encounter: Secondary | ICD-10-CM | POA: Diagnosis not present

## 2020-07-09 DIAGNOSIS — M25562 Pain in left knee: Secondary | ICD-10-CM | POA: Diagnosis not present

## 2020-07-09 DIAGNOSIS — Z87891 Personal history of nicotine dependence: Secondary | ICD-10-CM | POA: Diagnosis not present

## 2020-07-09 DIAGNOSIS — K219 Gastro-esophageal reflux disease without esophagitis: Secondary | ICD-10-CM | POA: Diagnosis not present

## 2020-07-09 DIAGNOSIS — R69 Illness, unspecified: Secondary | ICD-10-CM | POA: Diagnosis not present

## 2020-07-09 DIAGNOSIS — D649 Anemia, unspecified: Secondary | ICD-10-CM | POA: Diagnosis not present

## 2020-07-09 DIAGNOSIS — B351 Tinea unguium: Secondary | ICD-10-CM | POA: Diagnosis not present

## 2020-07-09 DIAGNOSIS — Z01818 Encounter for other preprocedural examination: Secondary | ICD-10-CM | POA: Diagnosis not present

## 2020-07-14 DIAGNOSIS — M25562 Pain in left knee: Secondary | ICD-10-CM | POA: Diagnosis not present

## 2020-07-15 DIAGNOSIS — T8454XA Infection and inflammatory reaction due to internal left knee prosthesis, initial encounter: Secondary | ICD-10-CM | POA: Diagnosis not present

## 2020-07-15 DIAGNOSIS — Z87891 Personal history of nicotine dependence: Secondary | ICD-10-CM | POA: Diagnosis not present

## 2020-07-15 DIAGNOSIS — K219 Gastro-esophageal reflux disease without esophagitis: Secondary | ICD-10-CM | POA: Diagnosis not present

## 2020-07-15 DIAGNOSIS — Z96642 Presence of left artificial hip joint: Secondary | ICD-10-CM | POA: Diagnosis not present

## 2020-07-15 DIAGNOSIS — Z471 Aftercare following joint replacement surgery: Secondary | ICD-10-CM | POA: Diagnosis not present

## 2020-07-15 DIAGNOSIS — M1611 Unilateral primary osteoarthritis, right hip: Secondary | ICD-10-CM | POA: Diagnosis not present

## 2020-07-15 DIAGNOSIS — Z96652 Presence of left artificial knee joint: Secondary | ICD-10-CM | POA: Diagnosis not present

## 2020-07-15 DIAGNOSIS — I441 Atrioventricular block, second degree: Secondary | ICD-10-CM | POA: Diagnosis not present

## 2020-07-15 DIAGNOSIS — G8918 Other acute postprocedural pain: Secondary | ICD-10-CM | POA: Diagnosis not present

## 2020-07-15 DIAGNOSIS — Y838 Other surgical procedures as the cause of abnormal reaction of the patient, or of later complication, without mention of misadventure at the time of the procedure: Secondary | ICD-10-CM | POA: Diagnosis not present

## 2020-07-15 DIAGNOSIS — Y793 Surgical instruments, materials and orthopedic devices (including sutures) associated with adverse incidents: Secondary | ICD-10-CM | POA: Diagnosis not present

## 2020-07-15 DIAGNOSIS — K2270 Barrett's esophagus without dysplasia: Secondary | ICD-10-CM | POA: Diagnosis not present

## 2020-07-15 DIAGNOSIS — I440 Atrioventricular block, first degree: Secondary | ICD-10-CM | POA: Diagnosis not present

## 2020-07-15 DIAGNOSIS — Z4733 Aftercare following explantation of knee joint prosthesis: Secondary | ICD-10-CM | POA: Diagnosis not present

## 2020-07-15 DIAGNOSIS — M160 Bilateral primary osteoarthritis of hip: Secondary | ICD-10-CM | POA: Diagnosis not present

## 2020-07-21 DIAGNOSIS — Z96652 Presence of left artificial knee joint: Secondary | ICD-10-CM | POA: Diagnosis not present

## 2020-07-21 DIAGNOSIS — I10 Essential (primary) hypertension: Secondary | ICD-10-CM | POA: Diagnosis not present

## 2020-07-21 DIAGNOSIS — Z471 Aftercare following joint replacement surgery: Secondary | ICD-10-CM | POA: Diagnosis not present

## 2020-07-21 DIAGNOSIS — M6281 Muscle weakness (generalized): Secondary | ICD-10-CM | POA: Diagnosis not present

## 2020-07-21 DIAGNOSIS — T8454XD Infection and inflammatory reaction due to internal left knee prosthesis, subsequent encounter: Secondary | ICD-10-CM | POA: Diagnosis not present

## 2020-07-21 DIAGNOSIS — R269 Unspecified abnormalities of gait and mobility: Secondary | ICD-10-CM | POA: Diagnosis not present

## 2020-07-23 DIAGNOSIS — Z471 Aftercare following joint replacement surgery: Secondary | ICD-10-CM | POA: Diagnosis not present

## 2020-07-23 DIAGNOSIS — Z96652 Presence of left artificial knee joint: Secondary | ICD-10-CM | POA: Diagnosis not present

## 2020-07-23 DIAGNOSIS — I10 Essential (primary) hypertension: Secondary | ICD-10-CM | POA: Diagnosis not present

## 2020-07-23 DIAGNOSIS — M6281 Muscle weakness (generalized): Secondary | ICD-10-CM | POA: Diagnosis not present

## 2020-07-23 DIAGNOSIS — T8454XD Infection and inflammatory reaction due to internal left knee prosthesis, subsequent encounter: Secondary | ICD-10-CM | POA: Diagnosis not present

## 2020-07-23 DIAGNOSIS — R269 Unspecified abnormalities of gait and mobility: Secondary | ICD-10-CM | POA: Diagnosis not present

## 2020-07-27 DIAGNOSIS — Z471 Aftercare following joint replacement surgery: Secondary | ICD-10-CM | POA: Diagnosis not present

## 2020-07-27 DIAGNOSIS — R269 Unspecified abnormalities of gait and mobility: Secondary | ICD-10-CM | POA: Diagnosis not present

## 2020-07-27 DIAGNOSIS — T8454XD Infection and inflammatory reaction due to internal left knee prosthesis, subsequent encounter: Secondary | ICD-10-CM | POA: Diagnosis not present

## 2020-07-27 DIAGNOSIS — Z96652 Presence of left artificial knee joint: Secondary | ICD-10-CM | POA: Diagnosis not present

## 2020-07-27 DIAGNOSIS — M6281 Muscle weakness (generalized): Secondary | ICD-10-CM | POA: Diagnosis not present

## 2020-07-27 DIAGNOSIS — I10 Essential (primary) hypertension: Secondary | ICD-10-CM | POA: Diagnosis not present

## 2020-07-29 DIAGNOSIS — R269 Unspecified abnormalities of gait and mobility: Secondary | ICD-10-CM | POA: Diagnosis not present

## 2020-07-29 DIAGNOSIS — T8454XD Infection and inflammatory reaction due to internal left knee prosthesis, subsequent encounter: Secondary | ICD-10-CM | POA: Diagnosis not present

## 2020-07-29 DIAGNOSIS — M6281 Muscle weakness (generalized): Secondary | ICD-10-CM | POA: Diagnosis not present

## 2020-07-29 DIAGNOSIS — Z471 Aftercare following joint replacement surgery: Secondary | ICD-10-CM | POA: Diagnosis not present

## 2020-07-29 DIAGNOSIS — Z96652 Presence of left artificial knee joint: Secondary | ICD-10-CM | POA: Diagnosis not present

## 2020-07-29 DIAGNOSIS — I10 Essential (primary) hypertension: Secondary | ICD-10-CM | POA: Diagnosis not present

## 2020-08-03 DIAGNOSIS — Z96652 Presence of left artificial knee joint: Secondary | ICD-10-CM | POA: Diagnosis not present

## 2020-08-03 DIAGNOSIS — Z471 Aftercare following joint replacement surgery: Secondary | ICD-10-CM | POA: Diagnosis not present

## 2020-08-03 DIAGNOSIS — T8454XD Infection and inflammatory reaction due to internal left knee prosthesis, subsequent encounter: Secondary | ICD-10-CM | POA: Diagnosis not present

## 2020-08-03 DIAGNOSIS — M6281 Muscle weakness (generalized): Secondary | ICD-10-CM | POA: Diagnosis not present

## 2020-08-03 DIAGNOSIS — I10 Essential (primary) hypertension: Secondary | ICD-10-CM | POA: Diagnosis not present

## 2020-08-03 DIAGNOSIS — R269 Unspecified abnormalities of gait and mobility: Secondary | ICD-10-CM | POA: Diagnosis not present

## 2020-08-05 DIAGNOSIS — T8454XD Infection and inflammatory reaction due to internal left knee prosthesis, subsequent encounter: Secondary | ICD-10-CM | POA: Diagnosis not present

## 2020-08-05 DIAGNOSIS — M6281 Muscle weakness (generalized): Secondary | ICD-10-CM | POA: Diagnosis not present

## 2020-08-05 DIAGNOSIS — R269 Unspecified abnormalities of gait and mobility: Secondary | ICD-10-CM | POA: Diagnosis not present

## 2020-08-05 DIAGNOSIS — I10 Essential (primary) hypertension: Secondary | ICD-10-CM | POA: Diagnosis not present

## 2020-08-05 DIAGNOSIS — Z471 Aftercare following joint replacement surgery: Secondary | ICD-10-CM | POA: Diagnosis not present

## 2020-08-05 DIAGNOSIS — Z96652 Presence of left artificial knee joint: Secondary | ICD-10-CM | POA: Diagnosis not present

## 2020-08-09 DIAGNOSIS — I10 Essential (primary) hypertension: Secondary | ICD-10-CM | POA: Diagnosis not present

## 2020-08-09 DIAGNOSIS — Z471 Aftercare following joint replacement surgery: Secondary | ICD-10-CM | POA: Diagnosis not present

## 2020-08-09 DIAGNOSIS — M6281 Muscle weakness (generalized): Secondary | ICD-10-CM | POA: Diagnosis not present

## 2020-08-09 DIAGNOSIS — Z96652 Presence of left artificial knee joint: Secondary | ICD-10-CM | POA: Diagnosis not present

## 2020-08-09 DIAGNOSIS — R269 Unspecified abnormalities of gait and mobility: Secondary | ICD-10-CM | POA: Diagnosis not present

## 2020-08-09 DIAGNOSIS — T8454XD Infection and inflammatory reaction due to internal left knee prosthesis, subsequent encounter: Secondary | ICD-10-CM | POA: Diagnosis not present

## 2020-08-11 DIAGNOSIS — M6281 Muscle weakness (generalized): Secondary | ICD-10-CM | POA: Diagnosis not present

## 2020-08-11 DIAGNOSIS — T8454XD Infection and inflammatory reaction due to internal left knee prosthesis, subsequent encounter: Secondary | ICD-10-CM | POA: Diagnosis not present

## 2020-08-11 DIAGNOSIS — I10 Essential (primary) hypertension: Secondary | ICD-10-CM | POA: Diagnosis not present

## 2020-08-11 DIAGNOSIS — Z471 Aftercare following joint replacement surgery: Secondary | ICD-10-CM | POA: Diagnosis not present

## 2020-08-11 DIAGNOSIS — Z96652 Presence of left artificial knee joint: Secondary | ICD-10-CM | POA: Diagnosis not present

## 2020-08-11 DIAGNOSIS — R269 Unspecified abnormalities of gait and mobility: Secondary | ICD-10-CM | POA: Diagnosis not present

## 2020-08-18 DIAGNOSIS — R269 Unspecified abnormalities of gait and mobility: Secondary | ICD-10-CM | POA: Diagnosis not present

## 2020-08-18 DIAGNOSIS — I10 Essential (primary) hypertension: Secondary | ICD-10-CM | POA: Diagnosis not present

## 2020-08-18 DIAGNOSIS — T8454XD Infection and inflammatory reaction due to internal left knee prosthesis, subsequent encounter: Secondary | ICD-10-CM | POA: Diagnosis not present

## 2020-08-18 DIAGNOSIS — Z96652 Presence of left artificial knee joint: Secondary | ICD-10-CM | POA: Diagnosis not present

## 2020-08-18 DIAGNOSIS — Z471 Aftercare following joint replacement surgery: Secondary | ICD-10-CM | POA: Diagnosis not present

## 2020-08-18 DIAGNOSIS — M6281 Muscle weakness (generalized): Secondary | ICD-10-CM | POA: Diagnosis not present

## 2020-08-20 DIAGNOSIS — M6281 Muscle weakness (generalized): Secondary | ICD-10-CM | POA: Diagnosis not present

## 2020-08-20 DIAGNOSIS — Z96652 Presence of left artificial knee joint: Secondary | ICD-10-CM | POA: Diagnosis not present

## 2020-08-20 DIAGNOSIS — R269 Unspecified abnormalities of gait and mobility: Secondary | ICD-10-CM | POA: Diagnosis not present

## 2020-08-20 DIAGNOSIS — I10 Essential (primary) hypertension: Secondary | ICD-10-CM | POA: Diagnosis not present

## 2020-08-20 DIAGNOSIS — Z471 Aftercare following joint replacement surgery: Secondary | ICD-10-CM | POA: Diagnosis not present

## 2020-08-20 DIAGNOSIS — T8454XD Infection and inflammatory reaction due to internal left knee prosthesis, subsequent encounter: Secondary | ICD-10-CM | POA: Diagnosis not present

## 2020-08-31 DIAGNOSIS — Z96652 Presence of left artificial knee joint: Secondary | ICD-10-CM | POA: Diagnosis not present

## 2020-10-19 DIAGNOSIS — Z8249 Family history of ischemic heart disease and other diseases of the circulatory system: Secondary | ICD-10-CM | POA: Diagnosis not present

## 2020-10-19 DIAGNOSIS — Z87891 Personal history of nicotine dependence: Secondary | ICD-10-CM | POA: Diagnosis not present

## 2020-10-19 DIAGNOSIS — Z96643 Presence of artificial hip joint, bilateral: Secondary | ICD-10-CM | POA: Diagnosis not present

## 2020-10-19 DIAGNOSIS — Z833 Family history of diabetes mellitus: Secondary | ICD-10-CM | POA: Diagnosis not present

## 2020-10-19 DIAGNOSIS — N529 Male erectile dysfunction, unspecified: Secondary | ICD-10-CM | POA: Diagnosis not present

## 2020-10-19 DIAGNOSIS — M1990 Unspecified osteoarthritis, unspecified site: Secondary | ICD-10-CM | POA: Diagnosis not present

## 2020-10-19 DIAGNOSIS — K2100 Gastro-esophageal reflux disease with esophagitis, without bleeding: Secondary | ICD-10-CM | POA: Diagnosis not present

## 2020-10-19 DIAGNOSIS — Z008 Encounter for other general examination: Secondary | ICD-10-CM | POA: Diagnosis not present

## 2020-10-19 DIAGNOSIS — G8929 Other chronic pain: Secondary | ICD-10-CM | POA: Diagnosis not present

## 2020-10-19 DIAGNOSIS — R030 Elevated blood-pressure reading, without diagnosis of hypertension: Secondary | ICD-10-CM | POA: Diagnosis not present

## 2020-10-19 DIAGNOSIS — Z809 Family history of malignant neoplasm, unspecified: Secondary | ICD-10-CM | POA: Diagnosis not present

## 2020-11-02 DIAGNOSIS — M1711 Unilateral primary osteoarthritis, right knee: Secondary | ICD-10-CM | POA: Diagnosis not present

## 2020-11-02 DIAGNOSIS — Z96652 Presence of left artificial knee joint: Secondary | ICD-10-CM | POA: Diagnosis not present

## 2020-11-09 DIAGNOSIS — M1711 Unilateral primary osteoarthritis, right knee: Secondary | ICD-10-CM | POA: Diagnosis not present

## 2020-11-09 DIAGNOSIS — M25561 Pain in right knee: Secondary | ICD-10-CM | POA: Diagnosis not present

## 2020-11-12 DIAGNOSIS — Z01818 Encounter for other preprocedural examination: Secondary | ICD-10-CM | POA: Diagnosis not present

## 2020-11-12 DIAGNOSIS — M1711 Unilateral primary osteoarthritis, right knee: Secondary | ICD-10-CM | POA: Diagnosis not present

## 2020-11-12 DIAGNOSIS — M25461 Effusion, right knee: Secondary | ICD-10-CM | POA: Diagnosis not present

## 2020-11-12 DIAGNOSIS — M25761 Osteophyte, right knee: Secondary | ICD-10-CM | POA: Diagnosis not present

## 2020-11-15 DIAGNOSIS — M25761 Osteophyte, right knee: Secondary | ICD-10-CM | POA: Diagnosis not present

## 2020-11-15 DIAGNOSIS — G8918 Other acute postprocedural pain: Secondary | ICD-10-CM | POA: Diagnosis not present

## 2020-11-15 DIAGNOSIS — M1711 Unilateral primary osteoarthritis, right knee: Secondary | ICD-10-CM | POA: Diagnosis not present

## 2020-11-17 DIAGNOSIS — Z7409 Other reduced mobility: Secondary | ICD-10-CM | POA: Diagnosis not present

## 2020-11-17 DIAGNOSIS — M25561 Pain in right knee: Secondary | ICD-10-CM | POA: Diagnosis not present

## 2020-11-23 DIAGNOSIS — M25561 Pain in right knee: Secondary | ICD-10-CM | POA: Diagnosis not present

## 2020-11-23 DIAGNOSIS — Z7409 Other reduced mobility: Secondary | ICD-10-CM | POA: Diagnosis not present

## 2020-11-30 DIAGNOSIS — Z7409 Other reduced mobility: Secondary | ICD-10-CM | POA: Diagnosis not present

## 2020-11-30 DIAGNOSIS — M25561 Pain in right knee: Secondary | ICD-10-CM | POA: Diagnosis not present

## 2020-12-08 DIAGNOSIS — Z7409 Other reduced mobility: Secondary | ICD-10-CM | POA: Diagnosis not present

## 2020-12-08 DIAGNOSIS — M25561 Pain in right knee: Secondary | ICD-10-CM | POA: Diagnosis not present

## 2020-12-23 DIAGNOSIS — M25561 Pain in right knee: Secondary | ICD-10-CM | POA: Diagnosis not present

## 2021-01-25 DIAGNOSIS — H524 Presbyopia: Secondary | ICD-10-CM | POA: Diagnosis not present

## 2021-02-11 DIAGNOSIS — M62838 Other muscle spasm: Secondary | ICD-10-CM | POA: Diagnosis not present

## 2021-02-11 DIAGNOSIS — Z125 Encounter for screening for malignant neoplasm of prostate: Secondary | ICD-10-CM | POA: Diagnosis not present

## 2021-02-11 DIAGNOSIS — Z0000 Encounter for general adult medical examination without abnormal findings: Secondary | ICD-10-CM | POA: Diagnosis not present

## 2021-02-11 DIAGNOSIS — R7303 Prediabetes: Secondary | ICD-10-CM | POA: Diagnosis not present

## 2021-02-11 DIAGNOSIS — K2270 Barrett's esophagus without dysplasia: Secondary | ICD-10-CM | POA: Diagnosis not present

## 2021-02-11 DIAGNOSIS — Z96642 Presence of left artificial hip joint: Secondary | ICD-10-CM | POA: Diagnosis not present

## 2021-02-11 DIAGNOSIS — R001 Bradycardia, unspecified: Secondary | ICD-10-CM | POA: Diagnosis not present

## 2021-02-11 DIAGNOSIS — M170 Bilateral primary osteoarthritis of knee: Secondary | ICD-10-CM | POA: Diagnosis not present

## 2021-02-11 DIAGNOSIS — E782 Mixed hyperlipidemia: Secondary | ICD-10-CM | POA: Diagnosis not present

## 2021-02-11 DIAGNOSIS — E559 Vitamin D deficiency, unspecified: Secondary | ICD-10-CM | POA: Diagnosis not present

## 2021-02-16 NOTE — Progress Notes (Signed)
Cardiology Office Note:    Date:  02/17/2021   ID:  EURA RUEDA, DOB 1955-10-18, MRN [id number]  PCP:  Tally Joe, MD   [facility] Providers Cardiologist:  Donato Schultz, MD     Referring MD: Tally Joe, MD   History of Present Illness:    Andrew Powell is a 66 y.o. male here for the evaluation of bradycardia at the request of Dr. Azucena Cecil.  Notes from 02/11/2021 visit with Dr. Azucena Cecil reviewed. On presentation Andrew Powell's heart rate was found to be in the 30s, confirmed with multiple checks. He was asymptomatic. EKG showed sinus bradycardia at 42. He was referred to cardiology for further evaluation. Also noted to have Barrett's esophagitis and esophageal stricture s/p multiple dilations. Previously cardiology was consulted for a pre-op assessment prior to his left knee arthroplasty, and it was felt to be low risk from a cardiac standpoint (heart rate 54 at that time). He had also seen Dr. Tiburcio Pea on 05/06/20 due to Mobitz one heart block.  Today: He is accompanied by his wife. Overall, he appears well. He feels fatigued very frequently, and often takes naps during the day. His fatigue occurs randomly with or without exertion.   When he checks his pulse, he sometimes feels the delay in his heart rate every few beats. He does not describe this as feeling palpitations.  He reports taking vasodilator supplements, and has been advised to stop fish oil.  No known congenital heart defects in his personal history or his family. His mother had hypertension. Father had possible heart attack, was prescribed nitroglycerin.  He denies any chest pain, or shortness of breath. No lightheadedness, headaches, syncope, orthopnea, PND, or lower extremity edema.   Past Medical History:  Diagnosis Date   Asthma    Barrett's esophagus    COVID-19    GERD (gastroesophageal reflux disease)    Hemorrhoids    Hypercalcemia    Hypercholesterolemia    Hyperlipidemia    Leukopenia     Osteoarthritis    Prediabetes    Seasonal allergic rhinitis due to pollen    Situational stress    Vitamin D deficiency     Past Surgical History:  Procedure Laterality Date   TOTAL HIP ARTHROPLASTY      Current Medications: Current Meds  Medication Sig   acetaminophen (TYLENOL) 650 MG CR tablet Take 650 mg by mouth every 8 (eight) hours as needed for pain.   amoxicillin (AMOXIL) 500 MG capsule Take 500 mg by mouth. 2 TABLETS ORALLY EVERY DAY 12 HRS BEFORE DENTAL PROCEDURE   Cholecalciferol 125 MCG (5000 UT) capsule Take by mouth.   Coenzyme Q10 (COQ10) 100 MG CAPS Take by mouth.   ferrous gluconate (FERGON) 240 (27 FE) MG tablet Take 240 mg by mouth 3 (three) times daily with meals.   Nutritional Supplements (GRAPESEED EXTRACT PO) Take by mouth.   Omega-3 Fatty Acids (FISH OIL) 1200 MG CAPS Take by mouth.   pantoprazole (PROTONIX) 40 MG tablet Take 40 mg by mouth daily.   tiZANidine (ZANAFLEX) 4 MG tablet Take 4 mg by mouth at bedtime.     Allergies:   Patient has no known allergies.   Social History   Socioeconomic History   Marital status: Married    Spouse name: Not on file   Number of children: Not on file   Years of education: Not on file   Highest education level: Not on file  Occupational History   Not on file  Tobacco  Use   Smoking status: Former    Types: Cigarettes    Quit date: 02/16/2001    Years since quitting: 20.0   Smokeless tobacco: Never  Substance and Sexual Activity   Alcohol use: Yes    Alcohol/week: 1.0 standard drink    Types: 1 Shots of liquor per week    Comment: RARELY   Drug use: Yes    Types: Marijuana   Sexual activity: Not on file  Other Topics Concern   Not on file  Social History Narrative   Not on file   Social Determinants of Health   Financial Resource Strain: Not on file  Food Insecurity: Not on file  Transportation Needs: Not on file  Physical Activity: Not on file  Stress: Not on file  Social Connections: Not on file      Family History: The patient's family history is not on file.  ROS:   Please see the history of present illness.    (+) Fatigue (+) Daytime somnolence All other systems reviewed and are negative.  EKGs/Labs/Other Studies Reviewed:    The following studies were reviewed today:  Echo 05/11/2020 [facility]): Impression: Left Ventricle: Doppler parameters are indeterminate for diastolic  function.    Mitral Valve: There is trace regurgitation.    Tricuspid Valve: There is mild to moderate regurgitation.    Pericardium: There is no pericardial effusion.  LE Venous Doppler 05/11/2020: Summary:  RIGHT:  - No evidence of common femoral vein obstruction.     LEFT:  - There is no evidence of deep vein thrombosis in the lower extremity.     - Ultrasound characteristics of enlarged lymph nodes noted in the groin.   - Complex fluid collection noted from the popliteal fossa extending to mid  calf.   EKG:  EKG is personally reviewed and interpreted. 02/17/2021: Sinus bradycardia. Rate 55 bpm. First degree AV block.  Recent Labs: 05/10/2020: ALT 13; BUN 15; Creatinine, Ser 0.75; Hemoglobin 12.0; Platelets 261; Potassium 4.4; Sodium 138   Recent Lipid Panel No results found for: CHOL, TRIG, HDL, CHOLHDL, VLDL, LDLCALC, LDLDIRECT   Risk Assessment/Calculations:          Physical Exam:    VS:  BP 110/80 (BP Location: Left Arm, Patient Position: Sitting, Cuff Size: Normal)    Pulse (!) 55    Ht 6' (1.829 m)    Wt 212 lb (96.2 kg)    SpO2 97%    BMI 28.75 kg/m     Wt Readings from Last 3 Encounters:  02/17/21 212 lb (96.2 kg)  05/11/20 207 lb (93.9 kg)     GEN: Well nourished, well developed in no acute distress HEENT: Normal NECK: No JVD; No carotid bruits LYMPHATICS: No lymphadenopathy CARDIAC: Bradycardic, no murmurs, rubs, gallops RESPIRATORY:  Clear to auscultation without rales, wheezing or rhonchi  ABDOMEN: Soft, non-tender, non-distended MUSCULOSKELETAL:  No  edema; No deformity  SKIN: Warm and dry NEUROLOGIC:  Alert and oriented x 3 PSYCHIATRIC:  Normal affect   ASSESSMENT:    1. Nonspecific abnormal electrocardiogram (ECG) (EKG)   2. First degree AV block    PLAN:    In order of problems listed above:  First degree AV block Very large first-degree AV block 560 ms.  No symptoms of syncope no shortness of breath no chest pain.  No congenital heart defect.  No history of Lyme disease or congenital heart disease.  His mother did not have lupus.  This was discovered during a preop left  knee surgery.  Echocardiogram performed in 2021 at [facility] showed trace mitral regurgitation, moderate tricuspid regurgitation with normal ejection fraction, normal.  TSH is normal.  Electrolytes are normal.  He did have mild hypercalcemia which has been worked up by endocrinology as well.  We will go ahead and perform an exercise treadmill test to ensure chronotropic competence.  I will also place a Zio patch monitor for 14 days to make sure that there is no degradation of this rhythm.  We did discuss that pacemakers are not warranted for first-degree AV block.  If symptoms worsen or become more worrisome or syncope occurs, he knows to seek medical attention as pacemaker may be warranted at that time.  He is not on any AV nodal blocking agents.  Continue with excellent diet and exercise.  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       Follow-up: 1 year if results of testing are normal.  Medication Adjustments/Labs and Tests Ordered: Current medicines are reviewed at length with the patient today.  Concerns regarding medicines are outlined above.   Orders Placed This Encounter  Procedures   Exercise Tolerance Test   LONG TERM MONITOR (3-14 DAYS)   EKG 12-Lead   No orders of the defined types were placed in this encounter.  Patient Instructions  Medication Instructions:  Your physician  recommends that you continue on your current medications as directed. Please refer to the Current Medication list given to you today.  *If you need a refill on your cardiac medications before your next appointment, please call your pharmacy*  Lab Work: None today If you have labs (blood work) drawn today and your tests are completely normal, you will receive your results only by: MyChart Message (if you have MyChart) OR A paper copy in the mail If you have any lab test that is abnormal or we need to change your treatment, we will call you to review the results.  Testing/Procedures: Your physician has requested that you have an exercise tolerance test. For further information please visit [email or web address] Please also follow instruction sheet, as given.  ZIO XT- Long Term Monitor Instructions  Your physician has requested you wear a ZIO patch monitor for 14 days.  This is a single patch monitor. [employer] supplies one patch monitor per enrollment. Additional stickers are not available. Please do not apply patch if you will be having a Nuclear Stress Test,  Echocardiogram, Cardiac CT, MRI, or Chest Xray during the period you would be wearing the  monitor. The patch cannot be worn during these tests. You cannot remove and re-apply the  ZIO XT patch monitor.  Your ZIO patch monitor will be mailed 3 day USPS to your address on file. It may take 3-5 days  to receive your monitor after you have been enrolled.  Once you have received your monitor, please review the enclosed instructions. Your monitor  has already been registered assigning a specific monitor serial # to you.  Billing and Patient Assistance Program Information  We have supplied [employer] with any of your insurance information on file for billing purposes. [employer] offers a sliding scale Patient Assistance Program for patients that do not have  insurance, or whose insurance does not completely cover the cost of the ZIO monitor.   You must apply for the Patient Assistance Program to qualify for this discounted rate.  To apply, please call [employer] at [phone], select option 4, select option 2, ask to apply  for  Patient Assistance Program. [employer]eth [employer] will ask your household income, and how many people  are in your household. They will quote your out-of-pocket cost based on that information.  [employer] will also be able to set up a 285-month, interest-free payment plan if needed.  Applying the monitor   Shave hair from upper left chest.  Hold abrader disc by orange tab. Rub abrader in 40 strokes over the upper left chest as  indicated in your monitor instructions.  Clean area with 4 enclosed alcohol pads. Let dry.  Apply patch as indicated in monitor instructions. Patch will be placed under collarbone on left  side of chest with arrow pointing upward.  Rub patch adhesive wings for 2 minutes. Remove white label marked "1". Remove the white  label marked "2". Rub patch adhesive wings for 2 additional minutes.  While looking in a mirror, press and release button in center of patch. A small green light will  flash 3-4 times. This will be your only indicator that the monitor has been turned on.  Do not shower for the first 24 hours. You may shower after the first 24 hours.  Press the button if you feel a symptom. You will hear a small click. Record Date, Time and  Symptom in the Patient Logbook.  When you are ready to remove the patch, follow instructions on the last 2 pages of Patient  Logbook. Stick patch monitor onto the last page of Patient Logbook.  Place Patient Logbook in the blue and white box. Use locking tab on box and tape box closed  securely. The blue and white box has prepaid postage on it. Please place it in the mailbox as  soon as possible. Your physician should have your test results approximately 7 days after the  monitor has been mailed back to [facility].  Call [facility] Customer Care at  [phone] if you have questions regarding  your ZIO XT patch monitor. Call them immediately if you see an orange light blinking on your  monitor.  If your monitor falls off in less than 4 days, contact our Monitor department at [phone].  If your monitor becomes loose or falls off after 4 days call [employer] at [phone] for  suggestions on securing your monitor   Follow-Up: At [facility], you and your health needs are our priority.  As part of our continuing mission to provide you with exceptional heart care, we have created designated Provider Care Teams.  These Care Teams include your primary Cardiologist (physician) and Advanced Practice Providers (APPs -  Physician Assistants and Nurse Practitioners) who all work together to provide you with the care you need, when you need it.  We recommend signing up for the patient portal called "MyChart".  Sign up information is provided on this After Visit Summary.  MyChart is used to connect with patients for Virtual Visits (Telemedicine).  Patients are able to view lab/test results, encounter notes, upcoming appointments, etc.  Non-urgent messages can be sent to your provider as well.   To learn more about what you can do with MyChart, go to [email or web address].    Your next appointment:   1 year(s)  The format for your next appointment:   In Person  Provider:   Donato SchultzMark Treniece Holsclaw, MD       [facility],Mathew Stumpf,acting as a scribe for Donato SchultzMark Jahir Halt, MD.,have documented all relevant documentation on the behalf of Donato SchultzMark Wynston Romey, MD,as directed by  Donato SchultzMark Kiante Ciavarella, MD while in the presence of [facility] Geral Coker,  MD.  Cherylann Parr, MD, have reviewed all documentation for this visit. The documentation on 02/17/21 for the exam, diagnosis, procedures, and orders are all accurate and complete.   Signed, Donato Schultz, MD  02/17/2021 12:07 PM    [facility]

## 2021-02-17 ENCOUNTER — Ambulatory Visit: Payer: Medicare HMO | Admitting: Cardiology

## 2021-02-17 ENCOUNTER — Ambulatory Visit (INDEPENDENT_AMBULATORY_CARE_PROVIDER_SITE_OTHER): Payer: Medicare HMO

## 2021-02-17 ENCOUNTER — Other Ambulatory Visit: Payer: Self-pay

## 2021-02-17 ENCOUNTER — Encounter: Payer: Self-pay | Admitting: Cardiology

## 2021-02-17 VITALS — BP 110/80 | HR 55 | Ht 72.0 in | Wt 212.0 lb

## 2021-02-17 DIAGNOSIS — I440 Atrioventricular block, first degree: Secondary | ICD-10-CM | POA: Diagnosis not present

## 2021-02-17 DIAGNOSIS — R9431 Abnormal electrocardiogram [ECG] [EKG]: Secondary | ICD-10-CM | POA: Diagnosis not present

## 2021-02-17 LAB — EXERCISE TOLERANCE TEST
Angina Index: 0
Duke Treadmill Score: 7
Estimated workload: 8.5
Exercise duration (min): 7 min
Exercise duration (sec): 0 s
MPHR: 154 {beats}/min
Peak HR: 136 {beats}/min
Percent HR: 88 %
RPE: 15
Rest HR: 56 {beats}/min
ST Depression (mm): 0 mm

## 2021-02-17 NOTE — Patient Instructions (Addendum)
Medication Instructions:  Your physician recommends that you continue on your current medications as directed. Please refer to the Current Medication list given to you today.  *If you need a refill on your cardiac medications before your next appointment, please call your pharmacy*  Lab Work: None today If you have labs (blood work) drawn today and your tests are completely normal, you will receive your results only by: MyChart Message (if you have MyChart) OR A paper copy in the mail If you have any lab test that is abnormal or we need to change your treatment, we will call you to review the results.  Testing/Procedures: Your physician has requested that you have an exercise tolerance test. For further information please visit [email or web address] Please also follow instruction sheet, as given.  ZIO XT- Long Term Monitor Instructions  Your physician has requested you wear a ZIO patch monitor for 14 days.  This is a single patch monitor. [employer] supplies one patch monitor per enrollment. Additional stickers are not available. Please do not apply patch if you will be having a Nuclear Stress Test,  Echocardiogram, Cardiac CT, MRI, or Chest Xray during the period you would be wearing the  monitor. The patch cannot be worn during these tests. You cannot remove and re-apply the  ZIO XT patch monitor.  Your ZIO patch monitor will be mailed 3 day USPS to your address on file. It may take 3-5 days  to receive your monitor after you have been enrolled.  Once you have received your monitor, please review the enclosed instructions. Your monitor  has already been registered assigning a specific monitor serial # to you.  Billing and Patient Assistance Program Information  We have supplied [employer] with any of your insurance information on file for billing purposes. [employer] offers a sliding scale Patient Assistance Program for patients that do not have  insurance, or whose insurance does not  completely cover the cost of the ZIO monitor.  You must apply for the Patient Assistance Program to qualify for this discounted rate.  To apply, please call [employer] at [phone], select option 4, select option 2, ask to apply for  Patient Assistance Program. Meredeth Ide will ask your household income, and how many people  are in your household. They will quote your out-of-pocket cost based on that information.  [employer] will also be able to set up a 75-month, interest-free payment plan if needed.  Applying the monitor   Shave hair from upper left chest.  Hold abrader disc by orange tab. Rub abrader in 40 strokes over the upper left chest as  indicated in your monitor instructions.  Clean area with 4 enclosed alcohol pads. Let dry.  Apply patch as indicated in monitor instructions. Patch will be placed under collarbone on left  side of chest with arrow pointing upward.  Rub patch adhesive wings for 2 minutes. Remove white label marked "1". Remove the white  label marked "2". Rub patch adhesive wings for 2 additional minutes.  While looking in a mirror, press and release button in center of patch. A small green light will  flash 3-4 times. This will be your only indicator that the monitor has been turned on.  Do not shower for the first 24 hours. You may shower after the first 24 hours.  Press the button if you feel a symptom. You will hear a small click. Record Date, Time and  Symptom in the Patient Logbook.  When you are ready to remove the patch, follow instructions  on the last 2 pages of Patient  Logbook. Stick patch monitor onto the last page of Patient Logbook.  Place Patient Logbook in the blue and white box. Use locking tab on box and tape box closed  securely. The blue and white box has prepaid postage on it. Please place it in the mailbox as  soon as possible. Your physician should have your test results approximately 7 days after the  monitor has been mailed back to [facility].  Call  [facility] at [phone] if you have questions regarding  your ZIO XT patch monitor. Call them immediately if you see an orange light blinking on your  monitor.  If your monitor falls off in less than 4 days, contact our Monitor department at [phone].  If your monitor becomes loose or falls off after 4 days call [employer] at [phone] for  suggestions on securing your monitor   Follow-Up: At [facility], you and your health needs are our priority.  As part of our continuing mission to provide you with exceptional heart care, we have created designated Provider Care Teams.  These Care Teams include your primary Cardiologist (physician) and Advanced Practice Providers (APPs -  Physician Assistants and Nurse Practitioners) who all work together to provide you with the care you need, when you need it.  We recommend signing up for the patient portal called "MyChart".  Sign up information is provided on this After Visit Summary.  MyChart is used to connect with patients for Virtual Visits (Telemedicine).  Patients are able to view lab/test results, encounter notes, upcoming appointments, etc.  Non-urgent messages can be sent to your provider as well.   To learn more about what you can do with MyChart, go to [email or web address].    Your next appointment:   1 year(s)  The format for your next appointment:   In Person  Provider:   Candee Furbish, MD

## 2021-02-17 NOTE — Progress Notes (Unsigned)
Enrolled patient for a 14 day Zio XT  monitor to be mailed to patients home  °

## 2021-02-17 NOTE — Assessment & Plan Note (Signed)
Very large first-degree AV block 560 ms.  No symptoms of syncope no shortness of breath no chest pain.  No congenital heart defect.  No history of Lyme disease or congenital heart disease.  His mother did not have lupus.  This was discovered during a preop left knee surgery.  Echocardiogram performed in 2021 at [facility] showed trace mitral regurgitation, moderate tricuspid regurgitation with normal ejection fraction, normal.  TSH is normal.  Electrolytes are normal.  He did have mild hypercalcemia which has been worked up by endocrinology as well.  We will go ahead and perform an exercise treadmill test to ensure chronotropic competence.  I will also place a Zio patch monitor for 14 days to make sure that there is no degradation of this rhythm.  We did discuss that pacemakers are not warranted for first-degree AV block.  If symptoms worsen or become more worrisome or syncope occurs, he knows to seek medical attention as pacemaker may be warranted at that time.  He is not on any AV nodal blocking agents.  Continue with excellent diet and exercise.  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

## 2021-02-20 DIAGNOSIS — R9431 Abnormal electrocardiogram [ECG] [EKG]: Secondary | ICD-10-CM

## 2021-02-20 DIAGNOSIS — I440 Atrioventricular block, first degree: Secondary | ICD-10-CM

## 2021-03-11 DIAGNOSIS — Z23 Encounter for immunization: Secondary | ICD-10-CM | POA: Diagnosis not present

## 2021-03-15 ENCOUNTER — Telehealth: Payer: Self-pay | Admitting: *Deleted

## 2021-03-15 DIAGNOSIS — Z01812 Encounter for preprocedural laboratory examination: Secondary | ICD-10-CM

## 2021-03-15 DIAGNOSIS — I459 Conduction disorder, unspecified: Secondary | ICD-10-CM

## 2021-03-15 DIAGNOSIS — I455 Other specified heart block: Secondary | ICD-10-CM

## 2021-03-15 DIAGNOSIS — I440 Atrioventricular block, first degree: Secondary | ICD-10-CM | POA: Diagnosis not present

## 2021-03-15 DIAGNOSIS — R9431 Abnormal electrocardiogram [ECG] [EKG]: Secondary | ICD-10-CM | POA: Diagnosis not present

## 2021-03-15 NOTE — Telephone Encounter (Signed)
?   Significant pauses, longest 7.2 seconds (6:20 AM). 8 episodes of third-degree AV block as well total 1 minute and 49 seconds.  ?Discussed with Dr. Lalla Brothers of electrophysiology.  ?First step will be to obtain a cardiac MRI to make sure that there is no scarring  ?Second will be EP office visit to discuss pacemaker implantation.  ?Obviously if he has any episode of syncope, he needs to report to the emergency department.  ? ?Donato Schultz, MD  ?

## 2021-03-15 NOTE — Telephone Encounter (Signed)
Pt is aware of orders and that he will be contacted to be scheduled for cardiac MRI, blood work here and referral to Dr Lalla Brothers for pacer placement.  Pt is aware should he experience symptoms of syncope to call 911 and report to the closest ED for evaluation and treatment.  ?

## 2021-03-15 NOTE — Telephone Encounter (Signed)
This result is being addressed.   Pt is aware per Dr Anne Fu and has been ordered to have Cardiac MRI and referral to EP (Dr Lalla Brothers) ? ?Please see results for further information.   ?

## 2021-03-15 NOTE — Telephone Encounter (Signed)
Received call transferred directly from operator and spoke with [employer].  Calling report on monitor- ? ?Patient wore monitor for 2 weeks.  Monitor showed 8 episodes of complete heart block. Slowest was 23 beats per minute.  Strip on page 8 ?Monitor showed 400 episodes of pauses.  Longest was 7.2 seconds.  Strip 11 ?Results are available  ?

## 2021-03-21 ENCOUNTER — Other Ambulatory Visit: Payer: Self-pay

## 2021-03-21 ENCOUNTER — Other Ambulatory Visit: Payer: Medicare HMO

## 2021-03-21 DIAGNOSIS — I459 Conduction disorder, unspecified: Secondary | ICD-10-CM

## 2021-03-21 DIAGNOSIS — I455 Other specified heart block: Secondary | ICD-10-CM | POA: Diagnosis not present

## 2021-03-21 DIAGNOSIS — Z01812 Encounter for preprocedural laboratory examination: Secondary | ICD-10-CM

## 2021-03-21 LAB — HEMOGLOBIN AND HEMATOCRIT, BLOOD
Hematocrit: 44.2 % (ref 37.5–51.0)
Hemoglobin: 14.7 g/dL (ref 13.0–17.7)

## 2021-03-22 ENCOUNTER — Telehealth (HOSPITAL_COMMUNITY): Payer: Self-pay | Admitting: *Deleted

## 2021-03-22 NOTE — Telephone Encounter (Signed)
Reaching out to patient to offer assistance regarding upcoming cardiac imaging study; pt verbalizes understanding of appt date/time, parking situation and where to check in, and verified current allergies; name and call back number provided for further questions should they arise ? ?Larey Brick RN Navigator Cardiac Imaging ?[facility] ?[phone] office ?[phone] cell ? ?Patient reports dental implants, knee replacement, and hip replacement. Denies claustrophobia. ?

## 2021-03-23 ENCOUNTER — Ambulatory Visit (HOSPITAL_COMMUNITY)
Admission: RE | Admit: 2021-03-23 | Discharge: 2021-03-23 | Disposition: A | Discharge status: Home / Self Care | Payer: Medicare HMO | Source: Ambulatory Visit | Attending: Cardiology | Admitting: Cardiology

## 2021-03-23 DIAGNOSIS — I455 Other specified heart block: Secondary | ICD-10-CM

## 2021-03-23 DIAGNOSIS — I459 Conduction disorder, unspecified: Secondary | ICD-10-CM | POA: Insufficient documentation

## 2021-03-23 MED ORDER — GADOBUTROL 1 MMOL/ML IV SOLN
10.0000 mL | Freq: Once | INTRAVENOUS | Status: AC | PRN
  Administered 2021-03-23: 10 mL via INTRAVENOUS

## 2021-03-29 DIAGNOSIS — Z96652 Presence of left artificial knee joint: Secondary | ICD-10-CM | POA: Diagnosis not present

## 2021-04-03 DIAGNOSIS — J0100 Acute maxillary sinusitis, unspecified: Secondary | ICD-10-CM | POA: Diagnosis not present

## 2021-04-06 ENCOUNTER — Other Ambulatory Visit: Payer: Self-pay

## 2021-04-06 ENCOUNTER — Ambulatory Visit (INDEPENDENT_AMBULATORY_CARE_PROVIDER_SITE_OTHER): Payer: Medicare HMO

## 2021-04-06 ENCOUNTER — Encounter: Payer: Self-pay | Admitting: Internal Medicine

## 2021-04-06 ENCOUNTER — Ambulatory Visit: Payer: Medicare HMO | Admitting: Internal Medicine

## 2021-04-06 VITALS — BP 126/80 | HR 48 | Ht 72.0 in | Wt 215.6 lb

## 2021-04-06 DIAGNOSIS — I440 Atrioventricular block, first degree: Secondary | ICD-10-CM

## 2021-04-06 DIAGNOSIS — R0683 Snoring: Secondary | ICD-10-CM | POA: Diagnosis not present

## 2021-04-06 DIAGNOSIS — I459 Conduction disorder, unspecified: Secondary | ICD-10-CM | POA: Diagnosis not present

## 2021-04-06 NOTE — Patient Instructions (Addendum)
Medication Instructions:  ?Your physician recommends that you continue on your current medications as directed. Please refer to the Current Medication list given to you today. ? ?Labwork: ?None ordered. ? ?Testing/Procedures: ?Your physician has recommended that you have a sleep study. This test records several body functions during sleep, including: brain activity, eye movement, oxygen and carbon dioxide blood levels, heart rate and rhythm, breathing rate and rhythm, the flow of air through your mouth and nose, snoring, body muscle movements, and chest and belly movement.  ? ?Your physician has recommended that you wear a holter monitor. Holter monitors are medical devices that record the heart?s electrical activity. Doctors most often use these monitors to diagnose arrhythmias. Arrhythmias are problems with the speed or rhythm of the heartbeat. The monitor is a small, portable device. You can wear one while you do your normal daily activities. This is usually used to diagnose what is causing palpitations/syncope (passing out). ? ?You will repeat a zio monitor in 2 months ? ?Follow-Up: ?Your physician wants you to follow-up in: 3 months with Dr. Graciela Husbands. ? ? ? ?Any Other Special Instructions Will Be Listed Below (If Applicable). ? ?If you need a refill on your cardiac medications before your next appointment, please call your pharmacy.  ? ? ? ? ?

## 2021-04-06 NOTE — Progress Notes (Signed)
? ? ? ? ?ELECTROPHYSIOLOGY CONSULT NOTE  ?Patient ID: Andrew Powell, MRN: [id number], DOB/AGE: [birth year] 67 y.o. ?Admit date: (Not on file) ?Date of Consult: 04/06/2021 ? ?Primary Physician: Tally Joe, MD ?Primary Cardiologist: MS ?  ?  ?Andrew Powell is a 66 y.o. male who is being seen today for the evaluation of abnormal monitor at the request of Dr. Anne Fu.  ? ? ?HPI ?Andrew Powell is a 66 y.o. male   referred because of an abnormal monitor.Referred because of an abnormal ? ?Having gone to see his PCP, he is noted that his heart rate was in the 30s and 40s, this became evident after he had surgery for an infected knee in the summer 2022, the home nurses finding heart rates repeatedly in the low 30s sometimes high 20s. ? ?He has had no symptoms of lightheadedness or syncope.  He denies shortness of breath.  He does have some fatigue.  No chest pain. ? ?We do not have data on him prior to this fall although I was able to find heart rate data in epic care everywhere of rates in the 60s.  He used to use an Apple watch and had had no problems with slow heart rates before his watch broke about a year ago ? ? ?Event Recorder personnally reviewed   ?Multiple pauses.  These are associated with both complete heart block as well as sinus arrest.  The longest pauses of which there were 3 the low August of which was 7.2 seconds   occurred at night ? ?There are multiple daytime rates in the 40s with some second-degree AV block type I ?  ? ?First-degree AV block at baseline.  He underwent treadmill testing under the care of Dr. Anne Fu.  Heart rate reached 80% of his maximum predicted at 136 bpm. ? ?Given the abnormalities he had discussed this with Dr. Merita Norton who recommended cMRI which is normal ? ?  ? ?DATE TEST EF   ?3/23 cMRI  56 % LGE neg  ?     ? ?Date Cr K Hgb  ?5/22 0.75 4.4 14.7  ?      ? ? ? ? ?Past Medical History:  ?Diagnosis Date  ? Asthma   ? Barrett's esophagus   ? COVID-19   ? GERD (gastroesophageal reflux  disease)   ? Hemorrhoids   ? Hypercalcemia   ? Hypercholesterolemia   ? Hyperlipidemia   ? Leukopenia   ? Osteoarthritis   ? Prediabetes   ? Seasonal allergic rhinitis due to pollen   ? Situational stress   ? Vitamin D deficiency   ?   ? ?Surgical History:  ?Past Surgical History:  ?Procedure Laterality Date  ? TOTAL HIP ARTHROPLASTY    ?  ? ?Home Meds: ?Current Meds  ?Medication Sig  ? acetaminophen (TYLENOL) 650 MG CR tablet Take 650 mg by mouth every 8 (eight) hours as needed for pain.  ? amoxicillin (AMOXIL) 500 MG capsule Take 500 mg by mouth. 2 TABLETS ORALLY EVERY DAY 12 HRS BEFORE DENTAL PROCEDURE  ? amoxicillin-clavulanate (AUGMENTIN) 875-125 MG tablet Take 1 tablet by mouth 2 (two) times daily.  ? Cholecalciferol 125 MCG (5000 UT) capsule Take by mouth.  ? Coenzyme Q10 (COQ10) 100 MG CAPS Take by mouth.  ? ferrous gluconate (FERGON) 240 (27 FE) MG tablet Take 240 mg by mouth 3 (three) times daily with meals.  ? Nutritional Supplements (GRAPESEED EXTRACT PO) Take by mouth.  ? Omega-3 Fatty Acids (FISH OIL)  1200 MG CAPS Take by mouth.  ? pantoprazole (PROTONIX) 40 MG tablet Take 40 mg by mouth daily.  ? tiZANidine (ZANAFLEX) 4 MG tablet Take 4 mg by mouth at bedtime.  ? ? ?Allergies: No Known Allergies ? ?Social History  ? ?Socioeconomic History  ? Marital status: Married  ?  Spouse name: Not on file  ? Number of children: Not on file  ? Years of education: Not on file  ? Highest education level: Not on file  ?Occupational History  ? Not on file  ?Tobacco Use  ? Smoking status: Former  ?  Types: Cigarettes  ?  Quit date: 02/16/2001  ?  Years since quitting: 20.1  ? Smokeless tobacco: Never  ?Substance and Sexual Activity  ? Alcohol use: Yes  ?  Alcohol/week: 1.0 standard drink  ?  Types: 1 Shots of liquor per week  ?  Comment: RARELY  ? Drug use: Yes  ?  Types: Marijuana  ? Sexual activity: Not on file  ?Other Topics Concern  ? Not on file  ?Social History Narrative  ? Not on file  ? ?Social Determinants of  Health  ? ?Financial Resource Strain: Not on file  ?Food Insecurity: Not on file  ?Transportation Needs: Not on file  ?Physical Activity: Not on file  ?Stress: Not on file  ?Social Connections: Not on file  ?Intimate Partner Violence: Not on file  ?  ? ?No family history on file.  ? ?ROS:  Please see the history of present illness.     All other systems reviewed and negative.  ? ? ?Physical Exam: ?Blood pressure 126/80, pulse (!) 48, height 6' (1.829 m), weight 215 lb 9.6 oz (97.8 kg), SpO2 98 %. ?General: Well developed, well nourished male in no acute distress. ?Head: Normocephalic, atraumatic, sclera non-icteric, no xanthomas, nares are without discharge. ?EENT: normal  ?Lymph Nodes:  none ?Neck: Negative for carotid bruits. JVD not elevated. ?Back:without scoliosis kyphosis  ?Lungs: Clear bilaterally to auscultation without wheezes, rales, or rhonchi. Breathing is unlabored. ?Heart: Slow and irregular RR with S1 S2. No systolic murmur . No rubs, or gallops appreciated. ?Abdomen: Soft, non-tender, non-distended with normoactive bowel sounds. No hepatomegaly. No rebound/guarding. No obvious abdominal masses. ?Msk:  Strength and tone appear normal for age. ?Extremities: No clubbing or cyanosis. No  edema.  Distal pedal pulses are 2+ and equal bilaterally. ?Skin: Warm and Dry ?Neuro: Alert and oriented X 3. CN III-XII intact Grossly normal sensory and motor function . ?Psych:  Responds to questions appropriately with a normal affect. ?  ?  ?  ? ?EKG: Sinus rhythm at 48 with second-degree AV block type I and with the recovery PR interval of 400 ms ? ? ? ? ?Assessment and Plan:  ?Sinus bradycardia and sinus node dysfunction with sinus arrest and junctional rhythms ? ?Mobitz 1 second-degree AV block and 2: 1 block; read as complete heart block but I do not think so ? ?Progression of sinus node dysfunction as suggested by changes in his heart rate detected by his watch and her prior doctor visits now with bradycardia  both of sinus origin as well as concomitant conduction block; sometimes these occur simultaneously at night which strongly suggests a component of sleep apnea I suggested that he undergo a sleep study. ? ?In the absence of symptoms it is hard to press for a pacemaker with second-degree heart block with junctional escape i.e. noninfrahisian.  I suspect however that this is a trajectory which will result  in pacing.  Hence, we will plan to see him again in 3 months with a repeat monitor.  He is advised to let us know if he has intercurrent symptoms of lightheadedness or progressive exercise intolerance and we discussed extensively and I was impressed at the absence of symptoms related to the first-degree AV block with simultaneous AV activation. ? ?As noted by Dr. Anne Fu he has adequate chronotropic response on his GXT. ? ? ? ?Sherryl Manges  ?

## 2021-04-06 NOTE — Progress Notes (Unsigned)
Enrolled patient for a 14 day Zio XT monitor to be mailed to patients home ? ?Patient to wear in May ?

## 2021-04-08 ENCOUNTER — Telehealth: Payer: Self-pay | Admitting: *Deleted

## 2021-04-08 NOTE — Telephone Encounter (Signed)
do not require Pre-Authorization by [employer]. ?

## 2021-04-08 NOTE — Telephone Encounter (Signed)
I tried to reach the pt in hopes to give him the PIN # so her can proceed with his Itamar study. I left a message for the pt that I will call back Monday.  ?

## 2021-04-11 NOTE — Telephone Encounter (Signed)
Called and made the patient aware that he may proceed with the Itamar Home Sleep Study. PIN # provided to the patient. Patient made aware that he will be contacted after the test has been read with the results and any recommendations. Patient verbalized understanding and thanked me for the call.  ? ? ?Pt will do sleep study this week  ?

## 2021-04-14 ENCOUNTER — Encounter (INDEPENDENT_AMBULATORY_CARE_PROVIDER_SITE_OTHER): Payer: Medicare HMO | Admitting: Cardiology

## 2021-04-14 DIAGNOSIS — G4733 Obstructive sleep apnea (adult) (pediatric): Secondary | ICD-10-CM | POA: Diagnosis not present

## 2021-04-15 ENCOUNTER — Telehealth: Payer: Self-pay | Admitting: *Deleted

## 2021-04-15 NOTE — Telephone Encounter (Signed)
See phone note

## 2021-04-15 NOTE — Telephone Encounter (Signed)
Patient had follow up questions for Okey Regal after he talked to her on Wednesday  ?

## 2021-04-15 NOTE — Telephone Encounter (Signed)
Operator Victorino Dike R. secured chatted me if I was able to take the call. I was then told the pt hung up. I then called the pt back. He told me that he did not hang up. He states that he was on gold for 7 minutes and then he said the phone went dead. He states he called back right away and got the same operator, who told the pt that she tried to contact me ,but I must be on the phone or away from my desk as I did not answer. I did tell the pt that was not a true statement that I sent a secure chat message back right away and yes I was available for the call, but call never came to me. I was told the pt hung up.  ? ?Pt states he just wanted to let me know he did the sleep study last night. He wanted to make sure that it went through. I pulled up Itamar and assured the pt that I can see the report came through. Pt asked when should he expect his results. I explained the process that the study will go to Dr. Mayford Knife to read. Once the study has been read by MD she will let the sleep dept know of results and any recommendations and they will call him with the results. Pt thanked me for the time and the call back. Pt did also ask about the Zio monitor. I assured the pt that I will have Orpha Bur, monitor technician call him in regard to [facility]. Pt said thank you again.  ?

## 2021-04-19 ENCOUNTER — Ambulatory Visit (HOSPITAL_COMMUNITY): Payer: Medicare HMO

## 2021-04-19 ENCOUNTER — Ambulatory Visit: Payer: Medicare HMO

## 2021-04-19 DIAGNOSIS — I459 Conduction disorder, unspecified: Secondary | ICD-10-CM

## 2021-04-19 DIAGNOSIS — R0683 Snoring: Secondary | ICD-10-CM

## 2021-04-19 NOTE — Procedures (Signed)
?  ? ? ?  SLEEP STUDY REPORT ?Patient Information ?Study Date: 04/14/21 ?Patient Name: Andrew Powell ?Patient ID: [id number] ?Birth Date: [birth year] ?Age: 66 ?Gender: Male ?BMI: 29.3 (W=216 lb, H=6' 0'') ?Neck Circ.: 17 '' ?Referring Physician: Virl Axe, MD ? ?TEST DESCRIPTION: Home sleep apnea testing was completed using the WatchPat, a Type 1 device, utilizing  ?peripheral arterial tonometry (PAT), chest movement, actigraphy, pulse oximetry, pulse rate, body position and snore.  ?AHI was calculated with apnea and hypopnea using valid sleep time as the denominator. RDI includes apneas,  ?hypopneas, and RERAs. The data acquired and the scoring of sleep and all associated events were performed in  ?accordance with the recommended standards and specifications as outlined in the AASM Manual for the Scoring of  ?Sleep and Associated Events 2.2.0 (2015).  ? ?FINDINGS:  ?1. Mild Obstructive Sleep Apnea with AHI11.6 /hr.  ?2. No Central Sleep Apnea with pAHIc 0/hr.  ?3. Oxygen desaturations as low as 82%.  ?4. Minimal snoring was present. O2 sats were < 88% for 2.1 min.  ?5. Total sleep time was hrs and min.  ?6. 0% of total sleep time was spent in REM sleep.  ?7. Shortened sleep onset latency at 5 min.  ?8. No REM sleep was present.  ?9. Total awakenings were 18. ?  ?DIAGNOSIS: ?Mild Obstructive Sleep Apnea (G47.33) ? ?RECOMMENDATIONS: ?1. Clinical correlation of these findings is necessary. The decision to treat obstructive sleep apnea (OSA) is usually  ?based on the presence of apnea symptoms or the presence of associated medical conditions such as Hypertension,  ?Congestive Heart Failure, Atrial Fibrillation or Obesity. The most common symptoms of OSA are snoring, gasping for  ?breath while sleeping, daytime sleepiness and fatigue.  ? ?2. Initiating apnea therapy is recommended given the presence of symptoms and/or associated conditions.  ?Recommend proceeding with one of the following: ? ? a. Auto-CPAP therapy with a  pressure range of 5-20cm H2O. ? ? b. An oral appliance (OA) that can be obtained from certain dentists with expertise in sleep medicine. These are  ?primarily of use in non-obese patients with mild and moderate disease. ? ? c. An ENT consultation which may be useful to look for specific causes of obstruction and possible treatment  ?options. ? ? d. If patient is intolerant to PAP therapy, consider referral to ENT for evaluation for hypoglossal nerve stimulator.  ? ?3. Close follow-up is necessary to ensure success with CPAP or oral appliance therapy for maximum benefit . ? ?4. A follow-up oximetry study on CPAP is recommended to assess the adequacy of therapy and determine the need  ?for supplemental oxygen or the potential need for Bi-level therapy. An arterial blood gas to determine the adequacy of  ?baseline ventilation and oxygenation should also be considered. ? ?5. Healthy sleep recommendations include: adequate nightly sleep (normal 7-9 hrs/night), avoidance of caffeine after  ?noon and alcohol near bedtime, and maintaining a sleep environment that is cool, dark and quiet. ? ?6. Weight loss for overweight patients is recommended. Even modest amounts of weight loss can significantly  ?improve the severity of sleep apnea. ? ?7. Snoring recommendations include: weight loss where appropriate, side sleeping, and avoidance of alcohol before  ?bed. ? ?8. Operation of motor vehicle should be avoided when sleepy. ? ?Signature: ?Electronically Signed: 04/19/21 ?Fransico Him, MD; [facility]; [place], [place] Board of  Sleep Medicine ? ?

## 2021-04-26 ENCOUNTER — Telehealth: Payer: Self-pay | Admitting: *Deleted

## 2021-04-26 DIAGNOSIS — G4733 Obstructive sleep apnea (adult) (pediatric): Secondary | ICD-10-CM

## 2021-04-26 NOTE — Telephone Encounter (Signed)
-----   Message from Gaynelle Cage, CMA sent at 04/20/2021  9:29 AM EDT ----- ? ?----- Message ----- ?From: Quintella Reichert, MD ?Sent: 04/19/2021   4:04 PM EDT ?To: [facility] Sleep Studies ? ?Please let patient know that they have sleep apnea and recommend treating with CPAP.  Please order an auto CPAP from 4-15cm H2O with heated humidity and mask of choice.  Order overnight pulse ox on CPAP.  Followup with me in 6 weeks.  ?  ? ?

## 2021-04-26 NOTE — Telephone Encounter (Signed)
The patient has been notified of the result and verbalized understanding.  All questions (if any) were answered. ?Marolyn Hammock, [place] 04/26/2021 5:48 PM   ?Pt is agreeable to his results  ? ? ?

## 2021-05-04 DIAGNOSIS — I459 Conduction disorder, unspecified: Secondary | ICD-10-CM

## 2021-05-30 ENCOUNTER — Other Ambulatory Visit (HOSPITAL_BASED_OUTPATIENT_CLINIC_OR_DEPARTMENT_OTHER): Payer: Self-pay

## 2021-06-01 ENCOUNTER — Telehealth: Payer: Self-pay | Admitting: Internal Medicine

## 2021-06-01 DIAGNOSIS — I459 Conduction disorder, unspecified: Secondary | ICD-10-CM | POA: Diagnosis not present

## 2021-06-01 NOTE — Telephone Encounter (Signed)
Follow Up:   Andrew Powell from [facility] is calling with results.

## 2021-06-01 NOTE — Telephone Encounter (Signed)
Follow Up:     Please call Raquel Sarna at [facility],, so she can give you the results.

## 2021-06-01 NOTE — Telephone Encounter (Signed)
Spoke with pt re monitor findings Per pt only symptom is some fatigue but can take nap and feels fine  Dr Graciela Husbands reviewed monitor results and after review pt may keep July appt unless is experiencing symptoms Pt will call if symptoms occur or worsening fatigue otherwise will see in July as planned ./cy

## 2021-06-03 ENCOUNTER — Encounter (HOSPITAL_COMMUNITY): Payer: Self-pay

## 2021-06-03 ENCOUNTER — Emergency Department (HOSPITAL_COMMUNITY): Payer: Medicare HMO

## 2021-06-03 ENCOUNTER — Other Ambulatory Visit: Payer: Self-pay

## 2021-06-03 ENCOUNTER — Emergency Department (HOSPITAL_COMMUNITY)
Admission: EM | Admit: 2021-06-03 | Discharge: 2021-06-03 | Disposition: A | Discharge status: Home / Self Care | Payer: Medicare HMO | Attending: Emergency Medicine | Admitting: Emergency Medicine

## 2021-06-03 DIAGNOSIS — R42 Dizziness and giddiness: Secondary | ICD-10-CM | POA: Diagnosis not present

## 2021-06-03 DIAGNOSIS — J45909 Unspecified asthma, uncomplicated: Secondary | ICD-10-CM | POA: Insufficient documentation

## 2021-06-03 DIAGNOSIS — R0602 Shortness of breath: Secondary | ICD-10-CM | POA: Diagnosis not present

## 2021-06-03 DIAGNOSIS — I441 Atrioventricular block, second degree: Secondary | ICD-10-CM | POA: Diagnosis not present

## 2021-06-03 DIAGNOSIS — R5383 Other fatigue: Secondary | ICD-10-CM | POA: Diagnosis not present

## 2021-06-03 DIAGNOSIS — R531 Weakness: Secondary | ICD-10-CM

## 2021-06-03 DIAGNOSIS — R9431 Abnormal electrocardiogram [ECG] [EKG]: Secondary | ICD-10-CM | POA: Diagnosis not present

## 2021-06-03 LAB — BASIC METABOLIC PANEL
Anion gap: 6 (ref 5–15)
BUN: 16 mg/dL (ref 8–23)
CO2: 26 mmol/L (ref 22–32)
Calcium: 10.3 mg/dL (ref 8.9–10.3)
Chloride: 107 mmol/L (ref 98–111)
Creatinine, Ser: 0.79 mg/dL (ref 0.61–1.24)
GFR, Estimated: >60 mL/min (ref >60)
Glucose, Bld: 100 mg/dL — ABNORMAL HIGH (ref 70–99)
Potassium: 4.2 mmol/L (ref 3.5–5.1)
Sodium: 139 mmol/L (ref 135–145)

## 2021-06-03 LAB — CBC
HCT: 45.3 % (ref 39.0–52.0)
Hemoglobin: 15.1 g/dL (ref 13.0–17.0)
MCH: 30.9 pg (ref 26.0–34.0)
MCHC: 33.3 g/dL (ref 30.0–36.0)
MCV: 92.6 fL (ref 80.0–100.0)
Platelets: 170 10*3/uL (ref 150–400)
RBC: 4.89 MIL/uL (ref 4.22–5.81)
RDW: 13.4 % (ref 11.5–15.5)
WBC: 3.6 10*3/uL — ABNORMAL LOW (ref 4.0–10.5)
nRBC: 0 % (ref 0.0–0.2)

## 2021-06-03 LAB — TROPONIN I (HIGH SENSITIVITY)
Troponin I (High Sensitivity): 4 ng/L (ref <18)
Troponin I (High Sensitivity): 4 ng/L (ref <18)

## 2021-06-03 NOTE — Consult Note (Signed)
Cardiology Consultation:   Patient ID: Andrew Powell MRN: [id number]; DOB: 1955-06-28  Admit date: 06/03/2021 Date of Consult: 06/03/2021  PCP:  Tally Joe, MD   [facility] Providers Cardiologist:  Donato Schultz, MD   {   Patient Profile:   Andrew Powell is a 66 y.o. male with a hx of hyperlipidemia, prediabetes, GERD who is being seen 06/03/2021 for the evaluation of fatigue, bradycardia at the request of Dr. Estell Harpin.  History of Present Illness:   Andrew Powell is a 66 year old male with past medical history noted above.  He has been seen by Dr. Anne Fu and referred to Dr. Graciela Husbands in the setting of abnormal cardiac monitor.  Initially referred to Dr. Anne Fu in the setting of bradycardia from his PCP.  He was noted to have a first-degree AV block on EKG but no symptoms of syncope, shortness of breath or chest pain.  Noted to have an echocardiogram done in 2021 at [facility] with trace mitral regurgitation, moderate tricuspid regurgitation with normal EF.  It was recommended that he have an exercise treadmill test to ensure chronotropic competence as well as a Zio patch.  While wearing a ZIO monitor he was noted to have multiple pauses as well as complete heart block.  Sinus arrest.  The longest of which was 7.2 seconds that occurred nocturnally.  Underwent treadmill testing and was able to reach 80% of his max predicted heart rate at 136 bpm.  Given his abnormalities his case was discussed with Dr. Lalla Brothers who recommended a cardiac MRI which was normal.  He was seen by Dr. Graciela Husbands 04/06/2021 and in the absence of symptoms it was recommended that he continued watchful waiting.  He recently wore a 14-day Zio patch of which noted first-degree AV block, longest pause of 4.8 seconds as well as continued episodes of Wenckebach.  Patient reported he was overall asymptomatic with only intermittent episodes of fatigue.  It was recommended that he keep his follow-up appointment in July unless experiencing  recurrent symptoms.  He presented to the ED on 5/26 with complaints of lightheadedness and fatigue.  States he was in his usual state of health when he woke up this morning, able to eat breakfast.  Took his wife to the gym and he walked the dogs as he felt more fatigued than usual.  Did have some brief lightheadedness which ultimately prompted him to present to the urgent care and then transferred to the ED for further evaluation.  Labs in the ED showed sodium 139, potassium 4.2, creatinine 0.79, high-sensitivity troponin 4>> 4, WBC 3.6, hemoglobin 15.1.  Chest x-ray negative.  Review of telemetry shows sinus bradycardia with first-degree AV block with significantly prolonged PR interval with episodes of WenckeBach.  No high degree AV block noted.  Blood pressures stable throughout evaluation in the ED.  Patient reports he has not had any recurrent fatigue or dizziness since being in the ED.  Cardiology asked to evaluate.   Past Medical History:  Diagnosis Date   Asthma    Barrett's esophagus    COVID-19    GERD (gastroesophageal reflux disease)    Hemorrhoids    Hypercalcemia    Hypercholesterolemia    Hyperlipidemia    Leukopenia    Osteoarthritis    Prediabetes    Seasonal allergic rhinitis due to pollen    Situational stress    Vitamin D deficiency     Past Surgical History:  Procedure Laterality Date   TOTAL HIP ARTHROPLASTY  Home Medications:  Prior to Admission medications   Medication Sig Start Date End Date Taking? Authorizing Provider  acetaminophen (TYLENOL) 650 MG CR tablet Take 1,300 mg by mouth daily.   Yes [provider]  Coenzyme Q10 (COQ10) 100 MG CAPS Take 100 mg by mouth daily.   Yes [provider]  ferrous gluconate (FERGON) 240 (27 FE) MG tablet Take 240 mg by mouth daily.   Yes [provider]  Nutritional Supplements (GRAPESEED EXTRACT PO) Take 400 mg by mouth daily.   Yes [provider]  Omega-3 Fatty Acids (FISH  OIL) 1200 MG CAPS Take 1,200 mg by mouth daily.   Yes [provider]  OVER THE COUNTER MEDICATION Take 600 mg by mouth daily. Antioxidant   Yes [provider]  pantoprazole (PROTONIX) 40 MG tablet Take 40 mg by mouth daily. 12/14/19  Yes [provider]  tiZANidine (ZANAFLEX) 4 MG tablet Take 4 mg by mouth at bedtime as needed for muscle spasms. 01/19/20  Yes [provider]    Inpatient Medications: Scheduled Meds:  Continuous Infusions:  PRN Meds:   Allergies:   No Known Allergies  Social History:   Social History   Socioeconomic History   Marital status: Married    Spouse name: Not on file   Number of children: Not on file   Years of education: Not on file   Highest education level: Not on file  Occupational History   Not on file  Tobacco Use   Smoking status: Former    Types: Cigarettes    Quit date: 02/16/2001    Years since quitting: 20.3   Smokeless tobacco: Never  Substance and Sexual Activity   Alcohol use: Yes    Alcohol/week: 1.0 standard drink    Types: 1 Shots of liquor per week    Comment: RARELY   Drug use: Yes    Types: Marijuana   Sexual activity: Not on file  Other Topics Concern   Not on file  Social History Narrative   Not on file   Social Determinants of Health   Financial Resource Strain: Not on file  Food Insecurity: Not on file  Transportation Needs: Not on file  Physical Activity: Not on file  Stress: Not on file  Social Connections: Not on file  Intimate Partner Violence: Not on file    Family History:   Family History  Problem Relation Age of Onset   Hypertension Father      ROS:  Please see the history of present illness.  No syncope; mild lightheadedness note related to position All other ROS reviewed and negative.     Physical Exam/Data:   Vitals:   06/03/21 1745 06/03/21 1800 06/03/21 1815 06/03/21 1830  BP: 134/82 (!) 143/82 125/85 (!) 156/93  Pulse: (!) 50 (!) 48 (!) 50 (!) 55   Resp: 16 14 15 14   Temp:      TempSrc:      SpO2: 99% 100% 100% 100%  Weight:      Height:       No intake or output data in the 24 hours ending 06/03/21 1859    06/03/2021    2:39 PM 04/06/2021   11:18 AM 02/17/2021   10:42 AM  Last 3 Weights  Weight (lbs) 217 lb 215 lb 9.6 oz 212 lb  Weight (kg) 98.431 kg 97.796 kg 96.163 kg     Body mass index is 28.63 kg/m.    Relevant CV Studies:  N/a  Laboratory Data:  High Sensitivity Troponin:   Recent Labs  Lab 06/03/21 1431 06/03/21 1754  TROPONINIHS 4 4     Chemistry Recent Labs  Lab 06/03/21 1431  NA 139  K 4.2  CL 107  CO2 26  GLUCOSE 100*  BUN 16  CREATININE 0.79  CALCIUM 10.3  GFRNONAA >60  ANIONGAP 6    No results for input(s): PROT, ALBUMIN, AST, ALT, ALKPHOS, BILITOT in the last 168 hours. Lipids No results for input(s): CHOL, TRIG, HDL, LABVLDL, LDLCALC, CHOLHDL in the last 168 hours.  Hematology Recent Labs  Lab 06/03/21 1431  WBC 3.6*  RBC 4.89  HGB 15.1  HCT 45.3  MCV 92.6  MCH 30.9  MCHC 33.3  RDW 13.4  PLT 170   Thyroid No results for input(s): TSH, FREET4 in the last 168 hours.  BNPNo results for input(s): BNP, PROBNP in the last 168 hours.  DDimer No results for input(s): DDIMER in the last 168 hours.   Radiology/Studies:  DG Chest Port 1 View  Result Date: 06/03/2021 CLINICAL DATA:  Shortness of breath. EXAM: PORTABLE CHEST 1 VIEW COMPARISON:  None Available. FINDINGS: Cardiac silhouette and mediastinal contours are within normal limits. The lungs are clear. No pleural effusion or moderate multilevel degenerative disc space narrowing and peripheral osteophytes of the thoracic spine. No acute skeletal abnormality. IMPRESSION: No active disease. Electronically Signed   By: Yvonne Kendall M.D.   On: 06/03/2021 16:02     Assessment and Plan:   Andrew Powell is a 65 y.o. male with a hx of hyperlipidemia, prediabetes, GERD who is being seen 06/03/2021 for the evaluation of fatigue,  bradycardia at the request of Dr. Roderic Palau.  Mobitz 1 second-degree AVB: known hx of the same. Has worn two cardiac monitors over the past several months. Overall he has been asymptomatic with EP following as an outpatient. Today he did report more fatigue and some dizziness that prompted him to come to the UC>>ED. In review of telemetry he continues to have a 1st degree AVB with Mobitz 1. Blood pressures are stable. He has felt ok while in the ED. He was able to ambulate in the ED without complaint and wished to be discharged home. Patient was seen in conjuction with Dr. Irish Lack and deemed reasonable to DC home with return precautions. Advised no driving. -- follow up message sent to the office to move up outpt visit currently scheduled in July  For questions or updates, please contact [place] Please consult [email or web address] for contact info under    Signed, Mirna Mires 06/03/2021 6:59 PM  I have examined the patient and reviewed assessment and plan and discussed with patient.  Agree with above as stated.    Patient with some lightheadedness that prompted ER visit.  He has a prolonged PR interval and Wenckebach AV block.  No complete heart block noted. No high risk arrhythmia per his report on recent outpatient monitor. We discussed overnight observation.  He feels well.  He walked in the ER without any problems.  He and his wife prefer that he go home.   If sx prefer, may need to consider ILR.  Will schedule f/u for the patient  Larae Grooms

## 2021-06-03 NOTE — ED Notes (Signed)
Patient ambulates around the nurses station at this time with a steady gait

## 2021-06-03 NOTE — ED Provider Notes (Signed)
[facility] EMERGENCY DEPARTMENT Provider Note   CSN: [id number] Arrival date & time: 06/03/21  1408     History  Chief Complaint  Patient presents with   Dizziness   Fatigue    Andrew Powell is a 66 y.o. male.  Patient presents to the emergency room complaining of lightheadedness and fatigue which is worsened over the past few days.  Patient has a history of a Mobitz type I heart block and has been monitored by cardiology over the past few months.  While being monitored he had frequent episodes of sinus pauses and occasionally showed evidence of a third-degree heart block.  He has an underlying rhythm of Mobitz type I.  Plans for follow-up with cardiology in July unless symptoms worsen.  Over this week his symptoms worsened.  His heart rate has been as low as the 30s and is in the 40s upon arrival.  He does not complain of chest pain, shortness of breath, abdominal pain, nausea, vomiting.  He endorses feeling "pauses" with worsening lightheadedness and fatigue after the episodes occur.  He also states that the episodes seem to be worse after activity.  Past medical history significant for hyperlipidemia, hypercalcemia, GERD, hypercholesterolemia, leukopenia, Barrett's esophagus, prediabetes, asthma.  Patient is currently followed by Dr. Graciela Husbands with [facility]  HPI     Home Medications Prior to Admission medications   Medication Sig Start Date End Date Taking? Authorizing Provider  acetaminophen (TYLENOL) 650 MG CR tablet Take 1,300 mg by mouth daily.   Yes [provider]  Coenzyme Q10 (COQ10) 100 MG CAPS Take 100 mg by mouth daily.   Yes [provider]  ferrous gluconate (FERGON) 240 (27 FE) MG tablet Take 240 mg by mouth daily.   Yes [provider]  Nutritional Supplements (GRAPESEED EXTRACT PO) Take 400 mg by mouth daily.   Yes [provider]  Omega-3 Fatty Acids (FISH OIL) 1200 MG CAPS Take 1,200 mg by mouth daily.   Yes  [provider]  OVER THE COUNTER MEDICATION Take 600 mg by mouth daily. Antioxidant   Yes [provider]  pantoprazole (PROTONIX) 40 MG tablet Take 40 mg by mouth daily. 12/14/19  Yes [provider]  tiZANidine (ZANAFLEX) 4 MG tablet Take 4 mg by mouth at bedtime as needed for muscle spasms. 01/19/20  Yes [provider]      Allergies    Patient has no known allergies.    Review of Systems   Review of Systems  Constitutional:  Positive for fatigue.  Respiratory:  Negative for shortness of breath.   Cardiovascular:  Negative for chest pain.  Gastrointestinal:  Negative for abdominal pain and nausea.  Neurological:  Positive for light-headedness.   Physical Exam Updated Vital Signs BP (!) 156/93   Pulse (!) 55   Temp 98.1 F (36.7 C) (Oral)   Resp 14   Ht 6\' 1"  (1.854 m)   Wt 98.4 kg   SpO2 100%   BMI 28.63 kg/m  Physical Exam Vitals and nursing note reviewed.  Constitutional:      General: He is not in acute distress. HENT:     Head: Normocephalic and atraumatic.  Eyes:     Conjunctiva/sclera: Conjunctivae normal.  Cardiovascular:     Rate and Rhythm: Normal rate and regular rhythm.     Pulses: Normal pulses.     Heart sounds: Normal heart sounds.  Pulmonary:     Effort: Pulmonary effort is normal.  Breath sounds: Normal breath sounds.  Musculoskeletal:     Cervical back: Normal range of motion.     Right lower leg: No edema.     Left lower leg: No edema.  Skin:    General: Skin is warm and dry.     Capillary Refill: Capillary refill takes less than 2 seconds.  Neurological:     General: No focal deficit present.     Mental Status: He is alert.    ED Results / Procedures / Treatments   Labs (all labs ordered are listed, but only abnormal results are displayed) Labs Reviewed  BASIC METABOLIC PANEL - Abnormal; Notable for the following components:      Result Value   Glucose, Bld 100 (*)    All other components  within normal limits  CBC - Abnormal; Notable for the following components:   WBC 3.6 (*)    All other components within normal limits  TROPONIN I (HIGH SENSITIVITY)  TROPONIN I (HIGH SENSITIVITY)    EKG None  Radiology DG Chest Port 1 View  Result Date: 06/03/2021 CLINICAL DATA:  Shortness of breath. EXAM: PORTABLE CHEST 1 VIEW COMPARISON:  None Available. FINDINGS: Cardiac silhouette and mediastinal contours are within normal limits. The lungs are clear. No pleural effusion or moderate multilevel degenerative disc space narrowing and peripheral osteophytes of the thoracic spine. No acute skeletal abnormality. IMPRESSION: No active disease. Electronically Signed   By: Neita Garnetonald  Viola M.D.   On: 06/03/2021 16:02    Procedures Procedures    Medications Ordered in ED Medications - No data to display  ED Course/ Medical Decision Making/ A&P                           Medical Decision Making Amount and/or Complexity of Data Reviewed Labs: ordered. Radiology: ordered.   This patient presents to the ED for concern of lightheadedness and weakness, this involves an extensive number of treatment options, and is a complaint that carries with it a high risk of complications and morbidity.  The differential diagnosis includes dysrhythmia, hypoxemia, CVA, ACS, and others   Co morbidities that complicate the patient evaluation  History of heart block   Additional history obtained:  Additional history obtained from patient's wife External records from outside source obtained and reviewed including urgent care records from today and cardiology notes over the past few months reviewing cardiac history   Lab Tests:  I Ordered, and personally interpreted labs.  The pertinent results include: WBC 3.6, consistent with patient's leukopenia, grossly normal BMP, initial troponin of 4   Imaging Studies ordered:  I ordered imaging studies including chest x-ray I independently visualized and  interpreted imaging which showed no active disease I agree with the radiologist interpretation   Cardiac Monitoring: / EKG:  The patient was maintained on a cardiac monitor.  I personally viewed and interpreted the cardiac monitored which showed an underlying rhythm of: Mobitz type I   Consultations Obtained:  I requested consultation with the cardiology team.   and discussed lab and imaging findings as well as pertinent plan - They agree to consult on the patient.  The rounding team for cardiology saw the patient.  They stated that it would be okay to discharge the patient if he was able to ambulate without feeling weak.  They recommended outpatient follow-up    Test / Admission - Considered:  The patient was able to ambulate with no difficulty.  The patient  stated that he feels fine after walking around the pod in the emergency department.  He would like to discharge home and per cardiology this is reasonable.  Patient plans to follow-up outpatient.  Return precautions provided  Final Clinical Impression(s) / ED Diagnoses Final diagnoses:  Weakness  Atrioventricular block, Mobitz type 1, Wenckebach    Rx / DC Orders ED Discharge Orders     None         Pamala Duffel 06/03/21 1857    Bethann Berkshire, MD 06/04/21 1013

## 2021-06-03 NOTE — ED Triage Notes (Signed)
Pt arrived POV from home c/o lightheadedness and fatigue for a couple days but today was worse. Pt was also told by his doctor that he had a 2nd degree block and was sent over.

## 2021-06-03 NOTE — Discharge Instructions (Addendum)
You were evaluated today for a feeling of lightheadedness and lethargy.  I recommend that you follow-up as discussed with cardiology outpatient after discharge home.  Please return to the emergency department if you develop any chest pain, worsening weakness, or other life-threatening conditions

## 2021-06-07 DIAGNOSIS — R001 Bradycardia, unspecified: Secondary | ICD-10-CM | POA: Insufficient documentation

## 2021-06-08 ENCOUNTER — Encounter: Payer: Self-pay | Admitting: Internal Medicine

## 2021-06-08 ENCOUNTER — Ambulatory Visit: Payer: Medicare HMO | Admitting: Internal Medicine

## 2021-06-08 VITALS — BP 114/70 | HR 57 | Ht 73.0 in | Wt 214.0 lb

## 2021-06-08 DIAGNOSIS — I459 Conduction disorder, unspecified: Secondary | ICD-10-CM

## 2021-06-08 DIAGNOSIS — R001 Bradycardia, unspecified: Secondary | ICD-10-CM | POA: Diagnosis not present

## 2021-06-08 NOTE — H&P (View-Only) (Signed)
Patient Care Team: Tally Joe, MD as PCP - General (Family Medicine) Jake Bathe, MD as PCP - Cardiology (Cardiology)   HPI  Andrew Powell is a 66 y.o. male seen in follow-up for daytime bradycardia with Mobitz 1 second-degree AV block, some nocturnal events with concomitant sinus bradycardia suggestive of hyper vagotonia  He comes in having worn another event recorder.  This was associated with daytime heart rates in the high 20s and low 30s, episodes of sinus arrest in addition to second-degree AV block and separate occurrences.  More recently, he had an event last week where with profound fatigue and lightheadedness he ended up in the emergency room.  Heart rates were in the 20s and 30s at home and when he arrived his ECG demonstrated junctional rates in the 12 high 20s and low 30s interspersed with sinus rhythm and Mobitz 1 heart block  Also has profound first-degree AV block  His wife is with him today and points out that over the last months he is significantly more fatigued.  Taking naps.  Stopping during activities.  DATE TEST EF    3/23 cMRI  56 % LGE neg             Date Cr K Hgb  5/22 0.75 4.4 14.7              Records and Results Reviewed   Past Medical History:  Diagnosis Date   Asthma    Barrett's esophagus    COVID-19    GERD (gastroesophageal reflux disease)    Hemorrhoids    Hypercalcemia    Hypercholesterolemia    Hyperlipidemia    Leukopenia    Osteoarthritis    Prediabetes    Seasonal allergic rhinitis due to pollen    Situational stress    Vitamin D deficiency     Past Surgical History:  Procedure Laterality Date   TOTAL HIP ARTHROPLASTY      Current Meds  Medication Sig   acetaminophen (TYLENOL) 650 MG CR tablet Take 1,300 mg by mouth daily.   Coenzyme Q10 (COQ10) 100 MG CAPS Take 100 mg by mouth daily.   ferrous gluconate (FERGON) 240 (27 FE) MG tablet Take 240 mg by mouth daily.   Nutritional Supplements (GRAPESEED  EXTRACT PO) Take 400 mg by mouth daily.   Omega-3 Fatty Acids (FISH OIL) 1200 MG CAPS Take 1,200 mg by mouth daily.   OVER THE COUNTER MEDICATION Take 600 mg by mouth daily. Antioxidant   pantoprazole (PROTONIX) 40 MG tablet Take 40 mg by mouth daily.   tiZANidine (ZANAFLEX) 4 MG tablet Take 4 mg by mouth at bedtime as needed for muscle spasms.    No Known Allergies    Review of Systems negative except from HPI and PMH  Physical Exam BP 114/70 (BP Location: Left Arm, Patient Position: Sitting, Cuff Size: Large)   Pulse (!) 57   Ht 6\' 1"  (1.854 m)   Wt 214 lb (97.1 kg)   SpO2 98%   BMI 28.23 kg/m  Well developed and well nourished in no acute distress HENT normal E scleral and icterus clear Neck Supple JVP flat; carotids brisk and full Clear to ausculation Slow and regular rate and rhythm, S1 quiet no murmurs gallops or rub Soft with active bowel sounds No clubbing cyanosis  Edema Alert and oriented, grossly normal motor and sensory function Skin Warm and Dry  ECG sinus rhythm with profound first-degree AV block 54/08/42  ECG reviewed  from hospital 05/31/2021 as above  Estimated Creatinine Clearance: 111.5 mL/min (by C-G formula based on SCr of 0.79 mg/dL).   Assessment and  Plan  Sinus bradycardia and sinus node dysfunction with sinus arrest and junctional rhythms   Mobitz 1 second-degree AV block and 2: 1 block; read as complete heart block but I do not think so  Fatigue and lightheadedness and presyncope  The patient has progressive symptoms associated with documented significant bradycardia secondary both to sinus node dysfunction as well as AV block.  Both have occurred during daytime hours in addition to nighttime hours. As noted above, symptoms took him to the emergency room.  We have discussed the role of pacing and I think it would likely be associate with significant improvement in symptoms.  The benefits and risks were reviewed including but not limited  to death,  perforation, infection, lead dislodgement and device malfunction.  The patient understands agrees and is willing to proceed.  Current medicines are reviewed at length with the patient today .  The patient does not  have concerns regarding medicines.

## 2021-06-08 NOTE — Progress Notes (Signed)
Patient Care Team: Tally Joe, MD as PCP - General (Family Medicine) Jake Bathe, MD as PCP - Cardiology (Cardiology)   HPI  Andrew Powell is a 66 y.o. male seen in follow-up for daytime bradycardia with Mobitz 1 second-degree AV block, some nocturnal events with concomitant sinus bradycardia suggestive of hyper vagotonia  He comes in having worn another event recorder.  This was associated with daytime heart rates in the high 20s and low 30s, episodes of sinus arrest in addition to second-degree AV block and separate occurrences.  More recently, he had an event last week where with profound fatigue and lightheadedness he ended up in the emergency room.  Heart rates were in the 20s and 30s at home and when he arrived his ECG demonstrated junctional rates in the 12 high 20s and low 30s interspersed with sinus rhythm and Mobitz 1 heart block  Also has profound first-degree AV block  His wife is with him today and points out that over the last months he is significantly more fatigued.  Taking naps.  Stopping during activities.  DATE TEST EF    3/23 cMRI  56 % LGE neg             Date Cr K Hgb  5/22 0.75 4.4 14.7              Records and Results Reviewed   Past Medical History:  Diagnosis Date   Asthma    Barrett's esophagus    COVID-19    GERD (gastroesophageal reflux disease)    Hemorrhoids    Hypercalcemia    Hypercholesterolemia    Hyperlipidemia    Leukopenia    Osteoarthritis    Prediabetes    Seasonal allergic rhinitis due to pollen    Situational stress    Vitamin D deficiency     Past Surgical History:  Procedure Laterality Date   TOTAL HIP ARTHROPLASTY      Current Meds  Medication Sig   acetaminophen (TYLENOL) 650 MG CR tablet Take 1,300 mg by mouth daily.   Coenzyme Q10 (COQ10) 100 MG CAPS Take 100 mg by mouth daily.   ferrous gluconate (FERGON) 240 (27 FE) MG tablet Take 240 mg by mouth daily.   Nutritional Supplements (GRAPESEED  EXTRACT PO) Take 400 mg by mouth daily.   Omega-3 Fatty Acids (FISH OIL) 1200 MG CAPS Take 1,200 mg by mouth daily.   OVER THE COUNTER MEDICATION Take 600 mg by mouth daily. Antioxidant   pantoprazole (PROTONIX) 40 MG tablet Take 40 mg by mouth daily.   tiZANidine (ZANAFLEX) 4 MG tablet Take 4 mg by mouth at bedtime as needed for muscle spasms.    No Known Allergies    Review of Systems negative except from HPI and PMH  Physical Exam BP 114/70 (BP Location: Left Arm, Patient Position: Sitting, Cuff Size: Large)   Pulse (!) 57   Ht 6\' 1"  (1.854 m)   Wt 214 lb (97.1 kg)   SpO2 98%   BMI 28.23 kg/m  Well developed and well nourished in no acute distress HENT normal E scleral and icterus clear Neck Supple JVP flat; carotids brisk and full Clear to ausculation Slow and regular rate and rhythm, S1 quiet no murmurs gallops or rub Soft with active bowel sounds No clubbing cyanosis  Edema Alert and oriented, grossly normal motor and sensory function Skin Warm and Dry  ECG sinus rhythm with profound first-degree AV block 54/08/42  ECG reviewed  from hospital 05/31/2021 as above  Estimated Creatinine Clearance: 111.5 mL/min (by C-G formula based on SCr of 0.79 mg/dL).   Assessment and  Plan  Sinus bradycardia and sinus node dysfunction with sinus arrest and junctional rhythms   Mobitz 1 second-degree AV block and 2: 1 block; read as complete heart block but I do not think so  Fatigue and lightheadedness and presyncope  The patient has progressive symptoms associated with documented significant bradycardia secondary both to sinus node dysfunction as well as AV block.  Both have occurred during daytime hours in addition to nighttime hours. As noted above, symptoms took him to the emergency room.  We have discussed the role of pacing and I think it would likely be associate with significant improvement in symptoms.  The benefits and risks were reviewed including but not limited  to death,  perforation, infection, lead dislodgement and device malfunction.  The patient understands agrees and is willing to proceed.  Current medicines are reviewed at length with the patient today .  The patient does not  have concerns regarding medicines.

## 2021-06-08 NOTE — Patient Instructions (Signed)
Medication Instructions:  Your physician recommends that you continue on your current medications as directed. Please refer to the Current Medication list given to you today.  *If you need a refill on your cardiac medications before your next appointment, please call your pharmacy*   Lab Work: None ordered.  If you have labs (blood work) drawn today and your tests are completely normal, you will receive your results only by: MyChart Message (if you have MyChart) OR A paper copy in the mail If you have any lab test that is abnormal or we need to change your treatment, we will call you to review the results.   Testing/Procedures: Your physician has recommended that you have a pacemaker inserted. A pacemaker is a small device that is placed under the skin of your chest or abdomen to help control abnormal heart rhythms. This device uses electrical pulses to prompt the heart to beat at a normal rate. Pacemakers are used to treat heart rhythms that are too slow. Wire (leads) are attached to the pacemaker that goes into the chambers of you heart. This is done in the hospital and usually requires and overnight stay. Please see the instruction sheet given to you today for more information.     Follow-Up: At [facility], you and your health needs are our priority.  As part of our continuing mission to provide you with exceptional heart care, we have created designated Provider Care Teams.  These Care Teams include your primary Cardiologist (physician) and Advanced Practice Providers (APPs -  Physician Assistants and Nurse Practitioners) who all work together to provide you with the care you need, when you need it.  We recommend signing up for the patient portal called "MyChart".  Sign up information is provided on this After Visit Summary.  MyChart is used to connect with patients for Virtual Visits (Telemedicine).  Patients are able to view lab/test results, encounter notes, upcoming appointments,  etc.  Non-urgent messages can be sent to your provider as well.   To learn more about what you can do with MyChart, go to [email or web address].    Your next appointment:   To be scheduled  Important Information About Sugar       

## 2021-06-10 ENCOUNTER — Ambulatory Visit (HOSPITAL_COMMUNITY)
Admission: RE | Admit: 2021-06-10 | Discharge: 2021-06-10 | Disposition: A | Discharge status: Home / Self Care | Payer: Medicare HMO | Source: Ambulatory Visit | Attending: Internal Medicine | Admitting: Internal Medicine

## 2021-06-10 ENCOUNTER — Encounter (HOSPITAL_COMMUNITY)
Admission: RE | Disposition: A | Discharge status: Home / Self Care | Payer: Self-pay | Source: Ambulatory Visit | Attending: Internal Medicine

## 2021-06-10 ENCOUNTER — Ambulatory Visit (HOSPITAL_COMMUNITY): Payer: Medicare HMO

## 2021-06-10 ENCOUNTER — Other Ambulatory Visit: Payer: Self-pay

## 2021-06-10 DIAGNOSIS — I442 Atrioventricular block, complete: Secondary | ICD-10-CM | POA: Diagnosis not present

## 2021-06-10 DIAGNOSIS — R001 Bradycardia, unspecified: Secondary | ICD-10-CM

## 2021-06-10 DIAGNOSIS — Z950 Presence of cardiac pacemaker: Secondary | ICD-10-CM | POA: Diagnosis not present

## 2021-06-10 DIAGNOSIS — R42 Dizziness and giddiness: Secondary | ICD-10-CM | POA: Insufficient documentation

## 2021-06-10 DIAGNOSIS — R55 Syncope and collapse: Secondary | ICD-10-CM | POA: Diagnosis not present

## 2021-06-10 DIAGNOSIS — R5383 Other fatigue: Secondary | ICD-10-CM | POA: Insufficient documentation

## 2021-06-10 DIAGNOSIS — I495 Sick sinus syndrome: Secondary | ICD-10-CM | POA: Diagnosis not present

## 2021-06-10 DIAGNOSIS — I441 Atrioventricular block, second degree: Secondary | ICD-10-CM | POA: Diagnosis not present

## 2021-06-10 HISTORY — PX: PACEMAKER IMPLANT: EP1218

## 2021-06-10 SURGERY — PACEMAKER IMPLANT
Anesthesia: LOCAL

## 2021-06-10 MED ORDER — FENTANYL CITRATE (PF) 100 MCG/2ML IJ SOLN
INTRAMUSCULAR | Status: DC | PRN
  Administered 2021-06-10 (×2): 25 ug via INTRAVENOUS
  Administered 2021-06-10: 50 ug via INTRAVENOUS
  Administered 2021-06-10: 25 ug via INTRAVENOUS

## 2021-06-10 MED ORDER — LIDOCAINE HCL 1 % IJ SOLN
INTRAMUSCULAR | Status: AC
  Filled 2021-06-10: qty 60

## 2021-06-10 MED ORDER — CEFAZOLIN SODIUM-DEXTROSE 2-4 GM/100ML-% IV SOLN
2.0000 g | INTRAVENOUS | Status: AC
  Administered 2021-06-10: 2 g via INTRAVENOUS

## 2021-06-10 MED ORDER — SODIUM CHLORIDE 0.9 % IV SOLN: INTRAVENOUS | Status: DC

## 2021-06-10 MED ORDER — MIDAZOLAM HCL 5 MG/5ML IJ SOLN
INTRAMUSCULAR | Status: AC
  Filled 2021-06-10: qty 5

## 2021-06-10 MED ORDER — SODIUM CHLORIDE 0.9 % IV SOLN
INTRAVENOUS | Status: AC
  Filled 2021-06-10: qty 2

## 2021-06-10 MED ORDER — SODIUM CHLORIDE 0.9 % IV SOLN
80.0000 mg | INTRAVENOUS | Status: AC
  Administered 2021-06-10: 80 mg

## 2021-06-10 MED ORDER — CHLORHEXIDINE GLUCONATE 4 % EX LIQD: 4.0000 "application " | Freq: Once | CUTANEOUS | Status: DC

## 2021-06-10 MED ORDER — HEPARIN (PORCINE) IN NACL 1000-0.9 UT/500ML-% IV SOLN
INTRAVENOUS | Status: AC
  Filled 2021-06-10: qty 500

## 2021-06-10 MED ORDER — FENTANYL CITRATE (PF) 100 MCG/2ML IJ SOLN
INTRAMUSCULAR | Status: AC
  Filled 2021-06-10: qty 2

## 2021-06-10 MED ORDER — ACETAMINOPHEN 325 MG PO TABS: 325.0000 mg | ORAL_TABLET | ORAL | Status: DC | PRN

## 2021-06-10 MED ORDER — ONDANSETRON HCL 4 MG/2ML IJ SOLN: 4.0000 mg | Freq: Four times a day (QID) | INTRAMUSCULAR | Status: DC | PRN

## 2021-06-10 MED ORDER — CEFAZOLIN SODIUM-DEXTROSE 2-4 GM/100ML-% IV SOLN
INTRAVENOUS | Status: AC
  Filled 2021-06-10: qty 100

## 2021-06-10 MED ORDER — MIDAZOLAM HCL 5 MG/5ML IJ SOLN
INTRAMUSCULAR | Status: DC | PRN
  Administered 2021-06-10: 2 mg via INTRAVENOUS
  Administered 2021-06-10: 1 mg via INTRAVENOUS

## 2021-06-10 MED ORDER — HEPARIN (PORCINE) IN NACL 2-0.9 UNITS/ML
INTRAMUSCULAR | Status: DC | PRN
  Administered 2021-06-10: 500 mL

## 2021-06-10 MED ORDER — LIDOCAINE HCL (PF) 1 % IJ SOLN
INTRAMUSCULAR | Status: DC | PRN
  Administered 2021-06-10: 2 mL
  Administered 2021-06-10: 50 mL

## 2021-06-10 SURGICAL SUPPLY — 15 items
CABLE SURGICAL S-101-97-12 (CABLE) ×3 IMPLANT
CATH CPS LOCATOR 3D MED (CATHETERS) ×1 IMPLANT
HELIX LOCKING TOOL (MISCELLANEOUS) ×2
LEAD TENDRIL MRI 52CM LPA1200M (Lead) ×1 IMPLANT
LEAD TENDRIL SDX 2088TC-58CM (Lead) ×1 IMPLANT
PACEMAKER ASSURITY DR-RF (Pacemaker) ×1 IMPLANT
PAD DEFIB RADIO PHYSIO CONN (PAD) ×3 IMPLANT
SHEATH 8FR PRELUDE SNAP 13 (SHEATH) ×1 IMPLANT
SHEATH 9FR PRELUDE SNAP 13 (SHEATH) ×1 IMPLANT
SHEATH PINNACLE 4F 10CM (SHEATH) IMPLANT
SHEATH PINNACLE 5F 10CM (SHEATH) ×1 IMPLANT
SLITTER AGILIS HISPRO (INSTRUMENTS) ×1 IMPLANT
TOOL HELIX LOCKING (MISCELLANEOUS) IMPLANT
TRAY PACEMAKER INSERTION (PACKS) ×3 IMPLANT
WIRE HI TORQ VERSACORE-J 145CM (WIRE) ×1 IMPLANT

## 2021-06-10 NOTE — Discharge Instructions (Signed)
After Your Pacemaker   You have a [employer] Pacemaker  ACTIVITY Do not lift your arm above shoulder height for 1 week after your procedure. After 7 days, you may progress as below.  You should remove your sling 24 hours after your procedure, unless otherwise instructed by your provider.     Friday June 17, 2021  Saturday June 18, 2021 Sunday June 19, 2021 Monday June 20, 2021   Do not lift, push, pull, or carry anything over 10 pounds with the affected arm until 6 weeks (Friday July 22, 2021 ) after your procedure.   You may drive AFTER your wound check, unless you have been told otherwise by your provider.   Ask your healthcare provider when you can go back to work   INCISION/Dressing  IF large square, outer bandage is left in place, this can be removed after 24 hours from your procedure. Do not remove steri-strips or glue as below.   Monitor your Pacemaker site for redness, swelling, and drainage. Call the device clinic at [phone] if you experience these symptoms or fever/chills.  If your incision is sealed with Steri-strips or staples, you may shower 7 days after your procedure or when told by your provider. Do not remove the steri-strips or let the shower hit directly on your site. You may wash around your site with soap and water.    If you were discharged in a sling, please do not wear this during the day more than 48 hours after your surgery unless otherwise instructed. This may increase the risk of stiffness and soreness in your shoulder.   Avoid lotions, ointments, or perfumes over your incision until it is well-healed.  You may use a hot tub or a pool AFTER your wound check appointment if the incision is completely closed.  Pacemaker Alerts:  Some alerts are vibratory and others beep. These are NOT emergencies. Please call our office to let us know. If this occurs at night or on weekends, it can wait until the next business day. Send a remote transmission.  If your  device is capable of reading fluid status (for heart failure), you will be offered monthly monitoring to review this with you.   DEVICE MANAGEMENT Remote monitoring is used to monitor your pacemaker from home. This monitoring is scheduled every 91 days by our office. It allows [place] to keep an eye on the functioning of your device to ensure it is working properly. You will routinely see your Electrophysiologist annually (more often if necessary).   You should receive your ID card for your new device in 4-8 weeks. Keep this card with you at all times once received. Consider wearing a medical alert bracelet or necklace.  Your Pacemaker may be MRI compatible. This will be discussed at your next office visit/wound check.  You should avoid contact with strong electric or magnetic fields.   Do not use amateur (ham) radio equipment or electric (arc) welding torches. MP3 player headphones with magnets should not be used. Some devices are safe to use if held at least 12 inches (30 cm) from your Pacemaker. These include power tools, lawn mowers, and speakers. If you are unsure if something is safe to use, ask your health care provider.  When using your cell phone, hold it to the ear that is on the opposite side from the Pacemaker. Do not leave your cell phone in a pocket over the Pacemaker.  You may safely use electric blankets, heating pads, computers, and microwave  ovens.  Call the office right away if: You have chest pain. You feel more short of breath than you have felt before. You feel more light-headed than you have felt before. Your incision starts to open up.  This information is not intended to replace advice given to you by your health care provider. Make sure you discuss any questions you have with your health care provider.

## 2021-06-10 NOTE — Progress Notes (Signed)
Cardiac monitor showing 3rd degree AV block and pacing; Dr Graciela Husbands aware

## 2021-06-10 NOTE — Interval H&P Note (Signed)
History and Physical Interval Note:  06/10/2021 11:38 AM  Quay Burow  has presented today for surgery, with the diagnosis of second degree AV block.  The various methods of treatment have been discussed with the patient and family. After consideration of risks, benefits and other options for treatment, the patient has consented to  Procedure(s): PACEMAKER IMPLANT (N/A) as a surgical intervention.  The patient's history has been reviewed, patient examined, no change in status, stable for surgery.  I have reviewed the patient's chart and labs.  Questions were answered to the patient's satisfaction.     Sherryl Manges

## 2021-06-10 NOTE — Interval H&P Note (Signed)
History and Physical Interval Note:  06/10/2021 11:21 AM  Andrew Powell  has presented today for surgery, with the diagnosis of second degree AV block.  The various methods of treatment have been discussed with the patient and family. After consideration of risks, benefits and other options for treatment, the patient has consented to  Procedure(s): PACEMAKER IMPLANT (N/A) as a surgical intervention.  The patient's history has been reviewed, patient examined, no change in status, stable for surgery.  I have reviewed the patient's chart and labs.  Questions were answered to the patient's satisfaction.     Sherryl Manges  The benefits and risks were reviewed including but not limited to death,  perforation, infection, lead dislodgement and device malfunction.  The patient understands agrees and is willing to proceed.

## 2021-06-10 NOTE — Progress Notes (Signed)
Ok to discharge home per Dr Graciela Husbands

## 2021-06-13 ENCOUNTER — Telehealth: Payer: Self-pay

## 2021-06-13 ENCOUNTER — Ambulatory Visit (INDEPENDENT_AMBULATORY_CARE_PROVIDER_SITE_OTHER): Payer: Medicare HMO | Admitting: Student

## 2021-06-13 ENCOUNTER — Encounter (HOSPITAL_COMMUNITY): Payer: Self-pay | Admitting: Internal Medicine

## 2021-06-13 VITALS — BP 112/64 | HR 63 | Ht 73.0 in | Wt 214.0 lb

## 2021-06-13 DIAGNOSIS — I459 Conduction disorder, unspecified: Secondary | ICD-10-CM

## 2021-06-13 DIAGNOSIS — R001 Bradycardia, unspecified: Secondary | ICD-10-CM

## 2021-06-13 NOTE — Progress Notes (Signed)
Seen per Dr. Graciela Husbands.   Pt left DDI 30 post implant.   Not dependent, pt with SND in 40s mostly, occasionally dropping to upper 30s, and briefly conducted at 60-62 bpm.   Changed to DDDR 60 per instructions.   RA and RV thresholds both 0.75 V @ 0.4 ms.   Keep device clinic visit for next week for steri strip removal.   Casimiro Needle "Otilio Saber, PA-C  06/13/2021 11:08 AM

## 2021-06-13 NOTE — Telephone Encounter (Signed)
-----   Message from Michael Andrew Tillery, PA-C sent at 06/10/2021  4:08 PM EDT ----- Same day PPM SK 6/2  

## 2021-06-13 NOTE — Telephone Encounter (Signed)
-----   Message from Graciella Freer, PA-C sent at 06/10/2021  4:08 PM EDT ----- Same day PPM SK 6/2

## 2021-06-13 NOTE — Telephone Encounter (Signed)
Follow-up after same day discharge: Implant date: 06/13/21 MD: Loman Brooklyn, MD Device: Medtronic [phone] Cl[place] MRI Quad CRT-D Location: Left Chest   Wound check visit: 06/23/21 @ 2:40 am 90 day MD follow-up: 09/13/21 @ 3:30 am  Remote Transmission received:Yes  Dressing/sling removed: Yes  Confirm OAC restart on: N/A

## 2021-06-23 ENCOUNTER — Ambulatory Visit (INDEPENDENT_AMBULATORY_CARE_PROVIDER_SITE_OTHER): Payer: Medicare HMO

## 2021-06-23 DIAGNOSIS — R001 Bradycardia, unspecified: Secondary | ICD-10-CM | POA: Diagnosis not present

## 2021-06-23 DIAGNOSIS — I459 Conduction disorder, unspecified: Secondary | ICD-10-CM | POA: Diagnosis not present

## 2021-06-23 LAB — CUP PACEART INCLINIC DEVICE CHECK
Battery Remaining Longevity: 51 mo
Battery Voltage: 3.05 V
Brady Statistic RA Percent Paced: 93 %
Brady Statistic RV Percent Paced: 99.97 %
Date Time Interrogation Session: 20230615145100
Implantable Lead Implant Date: 20230602
Implantable Lead Implant Date: 20230602
Implantable Lead Location: 753859
Implantable Lead Location: 753860
Implantable Pulse Generator Implant Date: 20230602
Lead Channel Impedance Value: 487.5 Ohm
Lead Channel Impedance Value: 487.5 Ohm
Lead Channel Pacing Threshold Amplitude: 0.75 V
Lead Channel Pacing Threshold Amplitude: 0.75 V
Lead Channel Pacing Threshold Amplitude: 0.75 V
Lead Channel Pacing Threshold Amplitude: 0.75 V
Lead Channel Pacing Threshold Pulse Width: 0.5 ms
Lead Channel Pacing Threshold Pulse Width: 0.5 ms
Lead Channel Pacing Threshold Pulse Width: 0.5 ms
Lead Channel Pacing Threshold Pulse Width: 0.5 ms
Lead Channel Sensing Intrinsic Amplitude: 12 mV
Lead Channel Sensing Intrinsic Amplitude: 4.6 mV
Lead Channel Setting Pacing Amplitude: 3.5 V
Lead Channel Setting Pacing Amplitude: 3.5 V
Lead Channel Setting Pacing Pulse Width: 0.5 ms
Lead Channel Setting Sensing Sensitivity: 2 mV
Pulse Gen Model: 2272
Pulse Gen Serial Number: 8084505

## 2021-06-23 NOTE — Progress Notes (Signed)
Wound check appointment. Steri-strips removed. Wound without redness or edema. Incision edges approximated, wound well healed. Normal device function. Thresholds, sensing, and impedances consistent with implant measurements. Device programmed at 3.5V/auto capture programmed on for extra safety margin until 3 month visit. Histogram distribution appropriate for patient and level of activity. No mode switches or high ventricular rates noted. Patient educated about wound care, arm mobility, lifting restrictions. ROV 09/13/21 w/ SK

## 2021-06-23 NOTE — Patient Instructions (Signed)
   After Your Pacemaker   Monitor your pacemaker site for redness, swelling, and drainage. Call the device clinic at [phone] if you experience these symptoms or fever/chills.  Your incision was closed with Steri-strips or staples:  You may shower 7 days after your procedure and wash your incision with soap and water. Avoid lotions, ointments, or perfumes over your incision until it is well-healed.  You may use a hot tub or a pool until 07/22/2021  Do not lift, push or pull greater than 10 pounds with the affected arm until 07/22/2021 There are no other restrictions in arm movement after your wound check appointment.  You may drive, unless driving has been restricted by your healthcare providers.   Remote monitoring is used to monitor your pacemaker from home. This monitoring is scheduled every 91 days by our office. It allows [place] to keep an eye on the functioning of your device to ensure it is working properly. You will routinely see your Electrophysiologist annually (more often if necessary).

## 2021-07-13 ENCOUNTER — Ambulatory Visit: Payer: Medicare HMO | Admitting: Internal Medicine

## 2021-09-13 ENCOUNTER — Ambulatory Visit (INDEPENDENT_AMBULATORY_CARE_PROVIDER_SITE_OTHER): Payer: Medicare HMO

## 2021-09-13 ENCOUNTER — Encounter: Payer: Self-pay | Admitting: Internal Medicine

## 2021-09-13 ENCOUNTER — Ambulatory Visit: Payer: Medicare HMO | Attending: Internal Medicine | Admitting: Internal Medicine

## 2021-09-13 VITALS — BP 124/86 | HR 60 | Ht 74.0 in | Wt 223.2 lb

## 2021-09-13 DIAGNOSIS — I459 Conduction disorder, unspecified: Secondary | ICD-10-CM | POA: Diagnosis not present

## 2021-09-13 DIAGNOSIS — Z950 Presence of cardiac pacemaker: Secondary | ICD-10-CM | POA: Diagnosis not present

## 2021-09-13 DIAGNOSIS — R001 Bradycardia, unspecified: Secondary | ICD-10-CM | POA: Diagnosis not present

## 2021-09-13 LAB — CUP PACEART REMOTE DEVICE CHECK
Battery Remaining Longevity: 58 mo
Battery Remaining Percentage: 95.5 %
Battery Voltage: 2.99 V
Brady Statistic AP VP Percent: 79 %
Brady Statistic AP VS Percent: 1 %
Brady Statistic AS VP Percent: 21 %
Brady Statistic AS VS Percent: 1 %
Brady Statistic RA Percent Paced: 79 %
Brady Statistic RV Percent Paced: 99 %
Date Time Interrogation Session: 20230905020012
Implantable Lead Implant Date: 20230602
Implantable Lead Implant Date: 20230602
Implantable Lead Location: 753859
Implantable Lead Location: 753860
Implantable Pulse Generator Implant Date: 20230602
Lead Channel Impedance Value: 490 Ohm
Lead Channel Impedance Value: 540 Ohm
Lead Channel Pacing Threshold Amplitude: 0.75 V
Lead Channel Pacing Threshold Amplitude: 0.75 V
Lead Channel Pacing Threshold Pulse Width: 0.5 ms
Lead Channel Pacing Threshold Pulse Width: 0.5 ms
Lead Channel Sensing Intrinsic Amplitude: 12 mV
Lead Channel Sensing Intrinsic Amplitude: 4.4 mV
Lead Channel Setting Pacing Amplitude: 3.5 V
Lead Channel Setting Pacing Amplitude: 3.5 V
Lead Channel Setting Pacing Pulse Width: 0.5 ms
Lead Channel Setting Sensing Sensitivity: 2 mV
Pulse Gen Model: 2272
Pulse Gen Serial Number: 8084505

## 2021-09-13 NOTE — Progress Notes (Signed)
Patient Care Team: Tally Joe, MD as PCP - General (Family Medicine) Jake Bathe, MD as PCP - Cardiology (Cardiology)   HPI  Andrew Powell is a 66 y.o. male seen in follow-up for daytime bradycardia with Mobitz 1 second-degree AV block, some nocturnal events with concomitant sinus bradycardia suggestive of hyper vagotonia  He comes in having worn another event recorder.  This was associated with daytime heart rates in the high 20s and low 30s, episodes of sinus arrest in addition to second-degree AV block and separate occurrences.  More recently, he had an event last week where with profound fatigue and lightheadedness he ended up in the emergency room.  Heart rates were in the 20s and 30s at home and when he arrived his ECG demonstrated junctional rates in the 12 high 20s and low 30s interspersed with sinus rhythm and Mobitz 1 heart block  Some improvement in fatigue less dyspnea think some of it is also they staying up too late. DATE TEST EF    3/23 cMRI  56 % LGE neg             Date Cr K Hgb  5/22 0.75 4.4 14.7   5/23 0.79 4.2 15.1     Records and Results Reviewed   Past Medical History:  Diagnosis Date   Asthma    Barrett's esophagus    COVID-19    GERD (gastroesophageal reflux disease)    Hemorrhoids    Hypercalcemia    Hypercholesterolemia    Hyperlipidemia    Leukopenia    Osteoarthritis    Prediabetes    Seasonal allergic rhinitis due to pollen    Situational stress    Vitamin D deficiency     Past Surgical History:  Procedure Laterality Date   PACEMAKER IMPLANT N/A 06/10/2021   Procedure: PACEMAKER IMPLANT;  Surgeon: Duke Salvia, MD;  Location: [facility] INVASIVE CV LAB;  Service: Cardiovascular;  Laterality: N/A;   TOTAL HIP ARTHROPLASTY      Current Meds  Medication Sig   acetaminophen (TYLENOL) 650 MG CR tablet Take 1,300 mg by mouth daily.   Coenzyme Q10 (COQ10) 100 MG CAPS Take 100 mg by mouth daily.   ferrous gluconate (FERGON) 240 (27  FE) MG tablet Take 240 mg by mouth daily.   Multiple Vitamin (MULTIVITAMIN) tablet Take 1 tablet by mouth daily.   Nutritional Supplements (GRAPESEED EXTRACT PO) Take 400 mg by mouth daily.   Omega-3 Fatty Acids (FISH OIL) 1200 MG CAPS Take 1,200 mg by mouth daily.   OVER THE COUNTER MEDICATION Take 600 mg by mouth daily. Antioxidant   pantoprazole (PROTONIX) 40 MG tablet Take 40 mg by mouth daily.   tiZANidine (ZANAFLEX) 4 MG tablet Take 4 mg by mouth at bedtime as needed for muscle spasms.    No Known Allergies    Review of Systems negative except from HPI and PMH  Physical Exam BP 124/86   Pulse 60   Ht 6\' 2"  (1.88 m)   Wt 223 lb 3.2 oz (101.2 kg)   SpO2 98%   BMI 28.66 kg/m  Well developed and well nourished in no acute distress HENT normal E scleral and icterus clear Neck Supple JVP flat; carotids brisk and full Clear to ausculation Slow and regular rate and rhythm, S1 quiet no murmurs gallops or rub Soft with active bowel sounds No clubbing cyanosis  Edema Alert and oriented, grossly normal motor and sensory function Skin Warm and Dry  ECG sinus rhythm with profound first-degree AV block 54/08/42  ECG reviewed from hospital 05/31/2021 as above  CrCl cannot be calculated (Patient's most recent lab result is older than the maximum 21 days allowed.).   Assessment and  Plan  Sinus bradycardia and sinus node dysfunction with sinus arrest and junctional rhythms   Mobitz 1 second-degree AV block and 2: 1 block; read as complete heart block but I do not think so  Fatigue and lightheadedness and presyncope  Pacemaker Abbott   Patient has some improvement in symptoms still some fatigue.  Staying up late.  Breathlessness is better. Device function is normal

## 2021-09-13 NOTE — Patient Instructions (Signed)
Medication Instructions:  Your physician recommends that you continue on your current medications as directed. Please refer to the Current Medication list given to you today.   *If you need a refill on your cardiac medications before your next appointment, please call your pharmacy*   Lab Work: None ordered.  If you have labs (blood work) drawn today and your tests are completely normal, you will receive your results only by: MyChart Message (if you have MyChart) OR A paper copy in the mail If you have any lab test that is abnormal or we need to change your treatment, we will call you to review the results.   Testing/Procedures: None ordered.    Follow-Up: At  [facility], you and your health needs are our priority.  As part of our continuing mission to provide you with exceptional heart care, we have created designated Provider Care Teams.  These Care Teams include your primary Cardiologist (physician) and Advanced Practice Providers (APPs -  Physician Assistants and Nurse Practitioners) who all work together to provide you with the care you need, when you need it.  We recommend signing up for the patient portal called "MyChart".  Sign up information is provided on this After Visit Summary.  MyChart is used to connect with patients for Virtual Visits (Telemedicine).  Patients are able to view lab/test results, encounter notes, upcoming appointments, etc.  Non-urgent messages can be sent to your provider as well.   To learn more about what you can do with MyChart, go to [email or web address].    Your next appointment:   9 months with Dr Klein  Important Information About Sugar       

## 2021-09-17 DIAGNOSIS — Z87891 Personal history of nicotine dependence: Secondary | ICD-10-CM | POA: Diagnosis not present

## 2021-09-17 DIAGNOSIS — Z807 Family history of other malignant neoplasms of lymphoid, hematopoietic and related tissues: Secondary | ICD-10-CM | POA: Diagnosis not present

## 2021-09-17 DIAGNOSIS — R252 Cramp and spasm: Secondary | ICD-10-CM | POA: Diagnosis not present

## 2021-09-17 DIAGNOSIS — Z8249 Family history of ischemic heart disease and other diseases of the circulatory system: Secondary | ICD-10-CM | POA: Diagnosis not present

## 2021-09-17 DIAGNOSIS — K219 Gastro-esophageal reflux disease without esophagitis: Secondary | ICD-10-CM | POA: Diagnosis not present

## 2021-09-17 DIAGNOSIS — L309 Dermatitis, unspecified: Secondary | ICD-10-CM | POA: Diagnosis not present

## 2021-09-17 DIAGNOSIS — M1990 Unspecified osteoarthritis, unspecified site: Secondary | ICD-10-CM | POA: Diagnosis not present

## 2021-09-17 DIAGNOSIS — N529 Male erectile dysfunction, unspecified: Secondary | ICD-10-CM | POA: Diagnosis not present

## 2021-09-17 DIAGNOSIS — Z950 Presence of cardiac pacemaker: Secondary | ICD-10-CM | POA: Diagnosis not present

## 2021-09-17 DIAGNOSIS — R030 Elevated blood-pressure reading, without diagnosis of hypertension: Secondary | ICD-10-CM | POA: Diagnosis not present

## 2021-10-03 NOTE — Progress Notes (Signed)
Remote pacemaker transmission.   

## 2021-12-13 ENCOUNTER — Ambulatory Visit (INDEPENDENT_AMBULATORY_CARE_PROVIDER_SITE_OTHER): Payer: Medicare HMO

## 2021-12-13 DIAGNOSIS — I440 Atrioventricular block, first degree: Secondary | ICD-10-CM | POA: Diagnosis not present

## 2021-12-13 LAB — CUP PACEART REMOTE DEVICE CHECK
Battery Remaining Longevity: 109 mo
Battery Remaining Percentage: 95.5 %
Battery Voltage: 3.01 V
Brady Statistic AP VP Percent: 69 %
Brady Statistic AP VS Percent: 1 %
Brady Statistic AS VP Percent: 30 %
Brady Statistic AS VS Percent: 1 %
Brady Statistic RA Percent Paced: 69 %
Brady Statistic RV Percent Paced: 99 %
Date Time Interrogation Session: 20231205020012
Implantable Lead Connection Status: 753985
Implantable Lead Connection Status: 753985
Implantable Lead Implant Date: 20230602
Implantable Lead Implant Date: 20230602
Implantable Lead Location: 753859
Implantable Lead Location: 753860
Implantable Pulse Generator Implant Date: 20230602
Lead Channel Impedance Value: 480 Ohm
Lead Channel Impedance Value: 480 Ohm
Lead Channel Pacing Threshold Amplitude: 0.75 V
Lead Channel Pacing Threshold Amplitude: 1 V
Lead Channel Pacing Threshold Pulse Width: 0.5 ms
Lead Channel Pacing Threshold Pulse Width: 0.5 ms
Lead Channel Sensing Intrinsic Amplitude: 12 mV
Lead Channel Sensing Intrinsic Amplitude: 3.8 mV
Lead Channel Setting Pacing Amplitude: 1.25 V
Lead Channel Setting Pacing Amplitude: 1.75 V
Lead Channel Setting Pacing Pulse Width: 0.5 ms
Lead Channel Setting Sensing Sensitivity: 2 mV
Pulse Gen Model: 2272
Pulse Gen Serial Number: 8084505

## 2022-01-10 NOTE — Progress Notes (Signed)
Remote pacemaker transmission.   

## 2022-03-03 DIAGNOSIS — M62838 Other muscle spasm: Secondary | ICD-10-CM | POA: Diagnosis not present

## 2022-03-03 DIAGNOSIS — Z125 Encounter for screening for malignant neoplasm of prostate: Secondary | ICD-10-CM | POA: Diagnosis not present

## 2022-03-03 DIAGNOSIS — M170 Bilateral primary osteoarthritis of knee: Secondary | ICD-10-CM | POA: Diagnosis not present

## 2022-03-03 DIAGNOSIS — R7303 Prediabetes: Secondary | ICD-10-CM | POA: Diagnosis not present

## 2022-03-03 DIAGNOSIS — I495 Sick sinus syndrome: Secondary | ICD-10-CM | POA: Diagnosis not present

## 2022-03-03 DIAGNOSIS — Z0000 Encounter for general adult medical examination without abnormal findings: Secondary | ICD-10-CM | POA: Diagnosis not present

## 2022-03-03 DIAGNOSIS — K2270 Barrett's esophagus without dysplasia: Secondary | ICD-10-CM | POA: Diagnosis not present

## 2022-03-03 DIAGNOSIS — Z1331 Encounter for screening for depression: Secondary | ICD-10-CM | POA: Diagnosis not present

## 2022-03-03 DIAGNOSIS — Z9181 History of falling: Secondary | ICD-10-CM | POA: Diagnosis not present

## 2022-03-03 DIAGNOSIS — E559 Vitamin D deficiency, unspecified: Secondary | ICD-10-CM | POA: Diagnosis not present

## 2022-03-03 DIAGNOSIS — Z96642 Presence of left artificial hip joint: Secondary | ICD-10-CM | POA: Diagnosis not present

## 2022-03-03 DIAGNOSIS — E782 Mixed hyperlipidemia: Secondary | ICD-10-CM | POA: Diagnosis not present

## 2022-03-14 ENCOUNTER — Ambulatory Visit (INDEPENDENT_AMBULATORY_CARE_PROVIDER_SITE_OTHER): Payer: Medicare HMO

## 2022-03-14 DIAGNOSIS — I440 Atrioventricular block, first degree: Secondary | ICD-10-CM | POA: Diagnosis not present

## 2022-03-15 LAB — CUP PACEART REMOTE DEVICE CHECK
Battery Remaining Longevity: 106 mo
Battery Remaining Percentage: 93 %
Battery Voltage: 3.01 V
Brady Statistic AP VP Percent: 68 %
Brady Statistic AP VS Percent: 1 %
Brady Statistic AS VP Percent: 32 %
Brady Statistic AS VS Percent: 1 %
Brady Statistic RA Percent Paced: 67 %
Brady Statistic RV Percent Paced: 99 %
Date Time Interrogation Session: 20240305020013
Implantable Lead Connection Status: 753985
Implantable Lead Connection Status: 753985
Implantable Lead Implant Date: 20230602
Implantable Lead Implant Date: 20230602
Implantable Lead Location: 753859
Implantable Lead Location: 753860
Implantable Pulse Generator Implant Date: 20230602
Lead Channel Impedance Value: 480 Ohm
Lead Channel Impedance Value: 480 Ohm
Lead Channel Pacing Threshold Amplitude: 0.75 V
Lead Channel Pacing Threshold Amplitude: 1 V
Lead Channel Pacing Threshold Pulse Width: 0.5 ms
Lead Channel Pacing Threshold Pulse Width: 0.5 ms
Lead Channel Sensing Intrinsic Amplitude: 12 mV
Lead Channel Sensing Intrinsic Amplitude: 3.9 mV
Lead Channel Setting Pacing Amplitude: 1.25 V
Lead Channel Setting Pacing Amplitude: 1.75 V
Lead Channel Setting Pacing Pulse Width: 0.5 ms
Lead Channel Setting Sensing Sensitivity: 2 mV
Pulse Gen Model: 2272
Pulse Gen Serial Number: 8084505

## 2022-04-19 DIAGNOSIS — H2513 Age-related nuclear cataract, bilateral: Secondary | ICD-10-CM | POA: Diagnosis not present

## 2022-04-19 NOTE — Progress Notes (Signed)
Remote pacemaker transmission.   

## 2022-04-21 DIAGNOSIS — Z0100 Encounter for examination of eyes and vision without abnormal findings: Secondary | ICD-10-CM | POA: Diagnosis not present

## 2022-04-25 ENCOUNTER — Other Ambulatory Visit: Payer: Self-pay

## 2022-06-13 ENCOUNTER — Ambulatory Visit (INDEPENDENT_AMBULATORY_CARE_PROVIDER_SITE_OTHER): Payer: Medicare HMO

## 2022-06-13 DIAGNOSIS — I440 Atrioventricular block, first degree: Secondary | ICD-10-CM

## 2022-06-13 LAB — CUP PACEART REMOTE DEVICE CHECK
Battery Remaining Longevity: 102 mo
Battery Remaining Percentage: 90 %
Battery Voltage: 3.01 V
Brady Statistic AP VP Percent: 68 %
Brady Statistic AP VS Percent: 1 %
Brady Statistic AS VP Percent: 32 %
Brady Statistic AS VS Percent: 1 %
Brady Statistic RA Percent Paced: 68 %
Brady Statistic RV Percent Paced: 99 %
Date Time Interrogation Session: 20240604020013
Implantable Lead Connection Status: 753985
Implantable Lead Connection Status: 753985
Implantable Lead Implant Date: 20230602
Implantable Lead Implant Date: 20230602
Implantable Lead Location: 753859
Implantable Lead Location: 753860
Implantable Pulse Generator Implant Date: 20230602
Lead Channel Impedance Value: 450 Ohm
Lead Channel Impedance Value: 480 Ohm
Lead Channel Pacing Threshold Amplitude: 0.75 V
Lead Channel Pacing Threshold Amplitude: 0.875 V
Lead Channel Pacing Threshold Pulse Width: 0.5 ms
Lead Channel Pacing Threshold Pulse Width: 0.5 ms
Lead Channel Sensing Intrinsic Amplitude: 12 mV
Lead Channel Sensing Intrinsic Amplitude: 4.5 mV
Lead Channel Setting Pacing Amplitude: 1.125
Lead Channel Setting Pacing Amplitude: 1.75 V
Lead Channel Setting Pacing Pulse Width: 0.5 ms
Lead Channel Setting Sensing Sensitivity: 2 mV
Pulse Gen Model: 2272
Pulse Gen Serial Number: 8084505

## 2022-07-05 NOTE — Progress Notes (Signed)
Remote pacemaker transmission.   

## 2022-08-31 ENCOUNTER — Other Ambulatory Visit: Payer: Self-pay

## 2022-08-31 ENCOUNTER — Encounter (HOSPITAL_BASED_OUTPATIENT_CLINIC_OR_DEPARTMENT_OTHER): Payer: Self-pay | Admitting: Emergency Medicine

## 2022-08-31 ENCOUNTER — Emergency Department (HOSPITAL_BASED_OUTPATIENT_CLINIC_OR_DEPARTMENT_OTHER): Payer: Medicare HMO

## 2022-08-31 DIAGNOSIS — S52551A Other extraarticular fracture of lower end of right radius, initial encounter for closed fracture: Secondary | ICD-10-CM | POA: Diagnosis not present

## 2022-08-31 DIAGNOSIS — M25471 Effusion, right ankle: Secondary | ICD-10-CM | POA: Diagnosis not present

## 2022-08-31 DIAGNOSIS — S52351A Displaced comminuted fracture of shaft of radius, right arm, initial encounter for closed fracture: Secondary | ICD-10-CM | POA: Diagnosis not present

## 2022-08-31 DIAGNOSIS — Y93K1 Activity, walking an animal: Secondary | ICD-10-CM | POA: Insufficient documentation

## 2022-08-31 DIAGNOSIS — S6991XA Unspecified injury of right wrist, hand and finger(s), initial encounter: Secondary | ICD-10-CM | POA: Diagnosis not present

## 2022-08-31 DIAGNOSIS — S93401A Sprain of unspecified ligament of right ankle, initial encounter: Secondary | ICD-10-CM | POA: Insufficient documentation

## 2022-08-31 DIAGNOSIS — M25571 Pain in right ankle and joints of right foot: Secondary | ICD-10-CM | POA: Diagnosis not present

## 2022-08-31 DIAGNOSIS — M7989 Other specified soft tissue disorders: Secondary | ICD-10-CM | POA: Insufficient documentation

## 2022-08-31 DIAGNOSIS — M1811 Unilateral primary osteoarthritis of first carpometacarpal joint, right hand: Secondary | ICD-10-CM | POA: Diagnosis not present

## 2022-08-31 DIAGNOSIS — W19XXXA Unspecified fall, initial encounter: Secondary | ICD-10-CM | POA: Diagnosis not present
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
## 2022-09-01 ENCOUNTER — Emergency Department (HOSPITAL_BASED_OUTPATIENT_CLINIC_OR_DEPARTMENT_OTHER)
Admission: EM | Admit: 2022-09-01 | Discharge: 2022-09-01 | Disposition: A | Discharge status: Home / Self Care | Payer: Medicare HMO | Attending: Emergency Medicine | Admitting: Emergency Medicine

## 2022-09-01 DIAGNOSIS — S93401A Sprain of unspecified ligament of right ankle, initial encounter: Secondary | ICD-10-CM

## 2022-09-01 DIAGNOSIS — S52551A Other extraarticular fracture of lower end of right radius, initial encounter for closed fracture: Secondary | ICD-10-CM
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
## 2022-09-08 DIAGNOSIS — S52551A Other extraarticular fracture of lower end of right radius, initial encounter for closed fracture: Secondary | ICD-10-CM | POA: Diagnosis not present

## 2022-09-12 ENCOUNTER — Ambulatory Visit (INDEPENDENT_AMBULATORY_CARE_PROVIDER_SITE_OTHER): Payer: Medicare HMO

## 2022-09-12 DIAGNOSIS — I440 Atrioventricular block, first degree: Secondary | ICD-10-CM | POA: Diagnosis not present

## 2022-09-12 LAB — CUP PACEART REMOTE DEVICE CHECK
Battery Remaining Longevity: 100 mo
Battery Remaining Percentage: 88 %
Battery Voltage: 3.01 V
Brady Statistic AP VP Percent: 67 %
Brady Statistic AP VS Percent: 1 %
Brady Statistic AS VP Percent: 33 %
Brady Statistic AS VS Percent: 1 %
Brady Statistic RA Percent Paced: 67 %
Brady Statistic RV Percent Paced: 99 %
Date Time Interrogation Session: 20240903020015
Implantable Lead Connection Status: 753985
Implantable Lead Connection Status: 753985
Implantable Lead Implant Date: 20230602
Implantable Lead Implant Date: 20230602
Implantable Lead Location: 753859
Implantable Lead Location: 753860
Implantable Pulse Generator Implant Date: 20230602
Lead Channel Impedance Value: 450 Ohm
Lead Channel Impedance Value: 460 Ohm
Lead Channel Pacing Threshold Amplitude: 0.625 V
Lead Channel Pacing Threshold Amplitude: 0.875 V
Lead Channel Pacing Threshold Pulse Width: 0.5 ms
Lead Channel Pacing Threshold Pulse Width: 0.5 ms
Lead Channel Sensing Intrinsic Amplitude: 12 mV
Lead Channel Sensing Intrinsic Amplitude: 3.3 mV
Lead Channel Setting Pacing Amplitude: 1.125
Lead Channel Setting Pacing Amplitude: 1.625
Lead Channel Setting Pacing Pulse Width: 0.5 ms
Lead Channel Setting Sensing Sensitivity: 2 mV
Pulse Gen Model: 2272
Pulse Gen Serial Number: 8084505

## 2022-09-19 NOTE — Progress Notes (Signed)
Remote pacemaker transmission.   

## 2022-10-02 DIAGNOSIS — M7671 Peroneal tendinitis, right leg: Secondary | ICD-10-CM | POA: Diagnosis not present

## 2022-10-02 DIAGNOSIS — S93491A Sprain of other ligament of right ankle, initial encounter: Secondary | ICD-10-CM | POA: Diagnosis not present

## 2022-10-10 DIAGNOSIS — S52551D Other extraarticular fracture of lower end of right radius, subsequent encounter for closed fracture with routine healing: Secondary | ICD-10-CM | POA: Diagnosis not present

## 2022-10-24 DIAGNOSIS — S93491A Sprain of other ligament of right ankle, initial encounter: Secondary | ICD-10-CM | POA: Diagnosis not present

## 2022-10-24 DIAGNOSIS — M7671 Peroneal tendinitis, right leg: Secondary | ICD-10-CM | POA: Diagnosis not present

## 2022-11-07 DIAGNOSIS — S52551A Other extraarticular fracture of lower end of right radius, initial encounter for closed fracture: Secondary | ICD-10-CM | POA: Diagnosis not present

## 2022-12-12 ENCOUNTER — Ambulatory Visit (INDEPENDENT_AMBULATORY_CARE_PROVIDER_SITE_OTHER): Payer: Medicare HMO

## 2022-12-12 DIAGNOSIS — I440 Atrioventricular block, first degree: Secondary | ICD-10-CM

## 2022-12-12 LAB — CUP PACEART REMOTE DEVICE CHECK
Battery Remaining Longevity: 98 mo
Battery Remaining Percentage: 85 %
Battery Voltage: 3.01 V
Brady Statistic AP VP Percent: 64 %
Brady Statistic AP VS Percent: 1 %
Brady Statistic AS VP Percent: 36 %
Brady Statistic AS VS Percent: 1 %
Brady Statistic RA Percent Paced: 64 %
Brady Statistic RV Percent Paced: 99 %
Date Time Interrogation Session: 20241203033856
Implantable Lead Connection Status: 753985
Implantable Lead Connection Status: 753985
Implantable Lead Implant Date: 20230602
Implantable Lead Implant Date: 20230602
Implantable Lead Location: 753859
Implantable Lead Location: 753860
Implantable Pulse Generator Implant Date: 20230602
Lead Channel Impedance Value: 450 Ohm
Lead Channel Impedance Value: 490 Ohm
Lead Channel Pacing Threshold Amplitude: 0.75 V
Lead Channel Pacing Threshold Amplitude: 0.75 V
Lead Channel Pacing Threshold Pulse Width: 0.5 ms
Lead Channel Pacing Threshold Pulse Width: 0.5 ms
Lead Channel Sensing Intrinsic Amplitude: 12 mV
Lead Channel Sensing Intrinsic Amplitude: 3.4 mV
Lead Channel Setting Pacing Amplitude: 1 V
Lead Channel Setting Pacing Amplitude: 1.75 V
Lead Channel Setting Pacing Pulse Width: 0.5 ms
Lead Channel Setting Sensing Sensitivity: 2 mV
Pulse Gen Model: 2272
Pulse Gen Serial Number: 8084505

## 2023-03-13 ENCOUNTER — Ambulatory Visit (INDEPENDENT_AMBULATORY_CARE_PROVIDER_SITE_OTHER): Payer: Medicare HMO

## 2023-03-13 DIAGNOSIS — R001 Bradycardia, unspecified: Secondary | ICD-10-CM

## 2023-03-15 DIAGNOSIS — E782 Mixed hyperlipidemia: Secondary | ICD-10-CM | POA: Diagnosis not present

## 2023-03-15 DIAGNOSIS — M170 Bilateral primary osteoarthritis of knee: Secondary | ICD-10-CM | POA: Diagnosis not present

## 2023-03-15 DIAGNOSIS — M62838 Other muscle spasm: Secondary | ICD-10-CM | POA: Diagnosis not present

## 2023-03-15 DIAGNOSIS — E559 Vitamin D deficiency, unspecified: Secondary | ICD-10-CM | POA: Diagnosis not present

## 2023-03-15 DIAGNOSIS — K2270 Barrett's esophagus without dysplasia: Secondary | ICD-10-CM | POA: Diagnosis not present

## 2023-03-15 DIAGNOSIS — R7303 Prediabetes: Secondary | ICD-10-CM | POA: Diagnosis not present

## 2023-03-15 DIAGNOSIS — N529 Male erectile dysfunction, unspecified: Secondary | ICD-10-CM | POA: Diagnosis not present

## 2023-03-15 DIAGNOSIS — Z1331 Encounter for screening for depression: Secondary | ICD-10-CM | POA: Diagnosis not present

## 2023-03-15 DIAGNOSIS — Z0000 Encounter for general adult medical examination without abnormal findings: Secondary | ICD-10-CM | POA: Diagnosis not present

## 2023-03-15 DIAGNOSIS — Z8639 Personal history of other endocrine, nutritional and metabolic disease: Secondary | ICD-10-CM | POA: Diagnosis not present

## 2023-03-15 DIAGNOSIS — I495 Sick sinus syndrome: Secondary | ICD-10-CM | POA: Diagnosis not present

## 2023-03-15 LAB — CUP PACEART REMOTE DEVICE CHECK
Battery Remaining Longevity: 95 mo
Battery Remaining Percentage: 82 %
Battery Voltage: 3.01 V
Brady Statistic AP VP Percent: 63 %
Brady Statistic AP VS Percent: 1 %
Brady Statistic AS VP Percent: 37 %
Brady Statistic AS VS Percent: 1 %
Brady Statistic RA Percent Paced: 63 %
Brady Statistic RV Percent Paced: 99 %
Date Time Interrogation Session: 20250304020013
Implantable Lead Connection Status: 753985
Implantable Lead Connection Status: 753985
Implantable Lead Implant Date: 20230602
Implantable Lead Implant Date: 20230602
Implantable Lead Location: 753859
Implantable Lead Location: 753860
Implantable Pulse Generator Implant Date: 20230602
Lead Channel Impedance Value: 440 Ohm
Lead Channel Impedance Value: 460 Ohm
Lead Channel Pacing Threshold Amplitude: 0.625 V
Lead Channel Pacing Threshold Amplitude: 0.75 V
Lead Channel Pacing Threshold Pulse Width: 0.5 ms
Lead Channel Pacing Threshold Pulse Width: 0.5 ms
Lead Channel Sensing Intrinsic Amplitude: 12 mV
Lead Channel Sensing Intrinsic Amplitude: 3.2 mV
Lead Channel Setting Pacing Amplitude: 1 V
Lead Channel Setting Pacing Amplitude: 1.625
Lead Channel Setting Pacing Pulse Width: 0.5 ms
Lead Channel Setting Sensing Sensitivity: 2 mV
Pulse Gen Model: 2272
Pulse Gen Serial Number: 8084505

## 2023-03-27 ENCOUNTER — Encounter: Payer: Self-pay | Admitting: Internal Medicine

## 2023-04-12 DIAGNOSIS — K219 Gastro-esophageal reflux disease without esophagitis: Secondary | ICD-10-CM | POA: Diagnosis not present

## 2023-04-16 NOTE — Progress Notes (Signed)
 Remote pacemaker transmission.

## 2023-04-16 NOTE — Addendum Note (Signed)
 Addended by: Geralyn Flash D on: 04/16/2023 03:51 PM   Modules accepted: Orders

## 2023-04-23 DIAGNOSIS — H2513 Age-related nuclear cataract, bilateral: Secondary | ICD-10-CM | POA: Diagnosis not present

## 2023-04-23 DIAGNOSIS — Z0100 Encounter for examination of eyes and vision without abnormal findings: Secondary | ICD-10-CM | POA: Diagnosis not present

## 2023-05-11 DIAGNOSIS — K2980 Duodenitis without bleeding: Secondary | ICD-10-CM | POA: Diagnosis not present

## 2023-05-11 DIAGNOSIS — K2270 Barrett's esophagus without dysplasia: Secondary | ICD-10-CM | POA: Diagnosis not present

## 2023-05-11 DIAGNOSIS — R12 Heartburn: Secondary | ICD-10-CM | POA: Diagnosis not present

## 2023-05-11 DIAGNOSIS — K315 Obstruction of duodenum: Secondary | ICD-10-CM | POA: Diagnosis not present

## 2023-05-11 DIAGNOSIS — K2289 Other specified disease of esophagus: Secondary | ICD-10-CM | POA: Diagnosis not present

## 2023-05-11 DIAGNOSIS — K2970 Gastritis, unspecified, without bleeding: Secondary | ICD-10-CM | POA: Diagnosis not present

## 2023-05-17 DIAGNOSIS — M79672 Pain in left foot: Secondary | ICD-10-CM | POA: Diagnosis not present

## 2023-05-17 DIAGNOSIS — S92515A Nondisplaced fracture of proximal phalanx of left lesser toe(s), initial encounter for closed fracture: Secondary | ICD-10-CM | POA: Diagnosis not present

## 2023-05-17 DIAGNOSIS — M109 Gout, unspecified: Secondary | ICD-10-CM | POA: Diagnosis not present

## 2023-06-12 ENCOUNTER — Ambulatory Visit (INDEPENDENT_AMBULATORY_CARE_PROVIDER_SITE_OTHER): Payer: Medicare HMO

## 2023-06-12 DIAGNOSIS — R001 Bradycardia, unspecified: Secondary | ICD-10-CM

## 2023-06-12 LAB — CUP PACEART REMOTE DEVICE CHECK
Battery Remaining Longevity: 92 mo
Battery Remaining Percentage: 80 %
Battery Voltage: 3.01 V
Brady Statistic AP VP Percent: 64 %
Brady Statistic AP VS Percent: 1 %
Brady Statistic AS VP Percent: 36 %
Brady Statistic AS VS Percent: 1 %
Brady Statistic RA Percent Paced: 64 %
Brady Statistic RV Percent Paced: 99 %
Date Time Interrogation Session: 20250603020025
Implantable Lead Connection Status: 753985
Implantable Lead Connection Status: 753985
Implantable Lead Implant Date: 20230602
Implantable Lead Implant Date: 20230602
Implantable Lead Location: 753859
Implantable Lead Location: 753860
Implantable Pulse Generator Implant Date: 20230602
Lead Channel Impedance Value: 430 Ohm
Lead Channel Impedance Value: 440 Ohm
Lead Channel Pacing Threshold Amplitude: 0.625 V
Lead Channel Pacing Threshold Amplitude: 0.75 V
Lead Channel Pacing Threshold Pulse Width: 0.5 ms
Lead Channel Pacing Threshold Pulse Width: 0.5 ms
Lead Channel Sensing Intrinsic Amplitude: 12 mV
Lead Channel Sensing Intrinsic Amplitude: 3.1 mV
Lead Channel Setting Pacing Amplitude: 1 V
Lead Channel Setting Pacing Amplitude: 1.625
Lead Channel Setting Pacing Pulse Width: 0.5 ms
Lead Channel Setting Sensing Sensitivity: 2 mV
Pulse Gen Model: 2272
Pulse Gen Serial Number: 8084505

## 2023-06-19 DIAGNOSIS — R52 Pain, unspecified: Secondary | ICD-10-CM | POA: Diagnosis not present

## 2023-06-19 DIAGNOSIS — S92515A Nondisplaced fracture of proximal phalanx of left lesser toe(s), initial encounter for closed fracture: Secondary | ICD-10-CM | POA: Diagnosis not present

## 2023-06-19 DIAGNOSIS — S46212A Strain of muscle, fascia and tendon of other parts of biceps, left arm, initial encounter: Secondary | ICD-10-CM | POA: Diagnosis not present

## 2023-06-22 ENCOUNTER — Ambulatory Visit: Payer: Self-pay | Admitting: Cardiology

## 2023-07-09 DIAGNOSIS — M170 Bilateral primary osteoarthritis of knee: Secondary | ICD-10-CM | POA: Diagnosis not present

## 2023-07-09 DIAGNOSIS — E782 Mixed hyperlipidemia: Secondary | ICD-10-CM | POA: Diagnosis not present

## 2023-07-12 DIAGNOSIS — R202 Paresthesia of skin: Secondary | ICD-10-CM | POA: Diagnosis not present

## 2023-07-12 DIAGNOSIS — M25512 Pain in left shoulder: Secondary | ICD-10-CM | POA: Diagnosis not present

## 2023-07-12 DIAGNOSIS — R7303 Prediabetes: Secondary | ICD-10-CM | POA: Diagnosis not present

## 2023-07-12 DIAGNOSIS — K2270 Barrett's esophagus without dysplasia: Secondary | ICD-10-CM | POA: Diagnosis not present

## 2023-07-12 DIAGNOSIS — E559 Vitamin D deficiency, unspecified: Secondary | ICD-10-CM | POA: Diagnosis not present

## 2023-07-12 DIAGNOSIS — M170 Bilateral primary osteoarthritis of knee: Secondary | ICD-10-CM | POA: Diagnosis not present

## 2023-07-12 DIAGNOSIS — F439 Reaction to severe stress, unspecified: Secondary | ICD-10-CM | POA: Diagnosis not present

## 2023-07-12 DIAGNOSIS — Z950 Presence of cardiac pacemaker: Secondary | ICD-10-CM | POA: Diagnosis not present

## 2023-07-12 DIAGNOSIS — N529 Male erectile dysfunction, unspecified: Secondary | ICD-10-CM | POA: Diagnosis not present

## 2023-07-12 DIAGNOSIS — E782 Mixed hyperlipidemia: Secondary | ICD-10-CM | POA: Diagnosis not present

## 2023-07-12 DIAGNOSIS — M62838 Other muscle spasm: Secondary | ICD-10-CM | POA: Diagnosis not present

## 2023-07-12 DIAGNOSIS — I495 Sick sinus syndrome: Secondary | ICD-10-CM | POA: Diagnosis not present

## 2023-07-17 DIAGNOSIS — R52 Pain, unspecified: Secondary | ICD-10-CM | POA: Diagnosis not present

## 2023-07-17 DIAGNOSIS — S92515A Nondisplaced fracture of proximal phalanx of left lesser toe(s), initial encounter for closed fracture: Secondary | ICD-10-CM | POA: Diagnosis not present

## 2023-07-18 DIAGNOSIS — S46212A Strain of muscle, fascia and tendon of other parts of biceps, left arm, initial encounter: Secondary | ICD-10-CM | POA: Diagnosis not present

## 2023-08-03 DIAGNOSIS — M75122 Complete rotator cuff tear or rupture of left shoulder, not specified as traumatic: Secondary | ICD-10-CM | POA: Diagnosis not present

## 2023-08-07 NOTE — Progress Notes (Signed)
 Remote pacemaker transmission.

## 2023-08-08 DIAGNOSIS — S92515A Nondisplaced fracture of proximal phalanx of left lesser toe(s), initial encounter for closed fracture: Secondary | ICD-10-CM | POA: Diagnosis not present

## 2023-08-09 ENCOUNTER — Ambulatory Visit: Admitting: Cardiology

## 2023-08-09 DIAGNOSIS — E782 Mixed hyperlipidemia: Secondary | ICD-10-CM | POA: Diagnosis not present

## 2023-08-09 DIAGNOSIS — M170 Bilateral primary osteoarthritis of knee: Secondary | ICD-10-CM | POA: Diagnosis not present

## 2023-09-01 ENCOUNTER — Other Ambulatory Visit: Payer: Self-pay | Admitting: Medical Genetics

## 2023-09-02 NOTE — Progress Notes (Unsigned)
  Cardiology Office Note:  .   Date:  09/02/2023  ID:  WILLET SCHLEIFER, DOB [birth year], MRN [id number] PCP: Seabron Lenis, MD  [facility] Providers Cardiologist:  Oneil Parchment, MD {  History of Present Illness: .   ESTELL DILLINGER is a 68 y.o. male w/PMHx of  Barrett's esophagus, HLD Symptomatic bradycardia/suspect hyper vagotonia > PPM  He saw Dr. Fernande 09/13/21, doing better post pacing   Today's visit is scheduled a pacer visit ROS:   He is doing well Active, busy Denies any CP, palpitations or cardiac awareness No SOB, DOE No near syncope or syncope  Device information SJM dual chamber PPM implanted 62/23  Studies Reviewed: SABRA    EKG done today and reviewed by myself:  AV paced 65bpm  DEVICE interrogation done today and reviewed by myself Battery and lead measurements are good He has had numerous AMS episodes All available EGMs are reviewed 4 true AFlutter, longest , others are ATach + PMTs > functioning algorithm    3/23 cMRI  56 % LGE neg     Risk Assessment/Calculations:    Physical Exam:   VS:  There were no vitals taken for this visit.   Wt Readings from Last 3 Encounters:  09/13/21 223 lb 3.2 oz (101.2 kg)  06/13/21 214 lb (97.1 kg)  06/10/21 212 lb (96.2 kg)    GEN: Well nourished, well developed in no acute distress NECK: No JVD; No carotid bruits CARDIAC: RRR, no murmurs, rubs, gallops RESPIRATORY:  CTA b/l without rales, wheezing or rhonchi  ABDOMEN: Soft, non-tender, non-distended EXTREMITIES: No edema; No deformity   PPM site: is stable, no thinning, fluctuation, tethering  ASSESSMENT AND PLAN: .    PPM intact function no programming changes made  2. SCAF <1% burden Rare and short Discussed with the patient Follow  Discussed need for new EP MD > will transition to Dr. Almetta  Dispo: remotes as usual, in clinic in a year again, sooner if needed  Signed, Charlies Macario Arthur, PA-C

## 2023-09-05 ENCOUNTER — Ambulatory Visit: Attending: Cardiovascular Disease | Admitting: Physician Assistant

## 2023-09-05 VITALS — BP 100/66 | HR 65 | Ht 73.0 in | Wt 211.0 lb

## 2023-09-05 DIAGNOSIS — Z950 Presence of cardiac pacemaker: Secondary | ICD-10-CM | POA: Diagnosis not present

## 2023-09-05 DIAGNOSIS — I480 Paroxysmal atrial fibrillation: Secondary | ICD-10-CM

## 2023-09-05 LAB — CUP PACEART INCLINIC DEVICE CHECK
Battery Remaining Longevity: 85 mo
Battery Voltage: 3.01 V
Brady Statistic RA Percent Paced: 65 %
Brady Statistic RV Percent Paced: 99.89 %
Date Time Interrogation Session: 20250827171724
Implantable Lead Connection Status: 753985
Implantable Lead Connection Status: 753985
Implantable Lead Implant Date: 20230602
Implantable Lead Implant Date: 20230602
Implantable Lead Location: 753859
Implantable Lead Location: 753860
Implantable Pulse Generator Implant Date: 20230602
Lead Channel Impedance Value: 437.5 Ohm
Lead Channel Impedance Value: 462.5 Ohm
Lead Channel Pacing Threshold Amplitude: 0.75 V
Lead Channel Pacing Threshold Amplitude: 0.75 V
Lead Channel Pacing Threshold Amplitude: 0.75 V
Lead Channel Pacing Threshold Pulse Width: 0.5 ms
Lead Channel Pacing Threshold Pulse Width: 0.5 ms
Lead Channel Pacing Threshold Pulse Width: 0.5 ms
Lead Channel Sensing Intrinsic Amplitude: 12 mV
Lead Channel Sensing Intrinsic Amplitude: 3.7 mV
Lead Channel Setting Pacing Amplitude: 1 V
Lead Channel Setting Pacing Amplitude: 1.625
Lead Channel Setting Pacing Pulse Width: 0.5 ms
Lead Channel Setting Sensing Sensitivity: 2 mV
Pulse Gen Model: 2272
Pulse Gen Serial Number: 8084505

## 2023-09-05 NOTE — Patient Instructions (Addendum)
 Medication Instructions:   Your physician recommends that you continue on your current medications as directed. Please refer to the Current Medication list given to you today.   *If you need a refill on your cardiac medications before your next appointment, please call your pharmacy*    Lab Work:  None  If you have labs (blood work) drawn today and your tests are completely normal, you will receive your results only by: MyChart Message (if you have MyChart) OR A paper copy in the mail If you have any lab test that is abnormal or we need to change your treatment, we will call you to review the results.    Testing/Procedures:  None   Follow-Up: 1 Year    At [facility], you and your health needs are our priority.  As part of our continuing mission to provide you with exceptional heart care, our providers are all part of one team.  This team includes your primary Cardiologist (physician) and Advanced Practice Providers or APPs (Physician Assistants and Nurse Practitioners) who all work together to provide you with the care you need, when you need it.   Your next appointment:     Provider:  Dr. Donnice Primus     We recommend signing up for the patient portal called MyChart.  Sign up information is provided on this After Visit Summary.  MyChart is used to connect with patients for Virtual Visits (Telemedicine).  Patients are able to view lab/test results, encounter notes, upcoming appointments, etc.  Non-urgent messages can be sent to your provider as well.   To learn more about what you can do with MyChart, go to [email or web address].     Other Instructions

## 2023-09-06 ENCOUNTER — Ambulatory Visit: Payer: Self-pay | Admitting: Cardiology

## 2023-09-06 DIAGNOSIS — M79642 Pain in left hand: Secondary | ICD-10-CM | POA: Diagnosis not present

## 2023-09-11 ENCOUNTER — Ambulatory Visit (INDEPENDENT_AMBULATORY_CARE_PROVIDER_SITE_OTHER): Payer: Medicare HMO

## 2023-09-11 DIAGNOSIS — I480 Paroxysmal atrial fibrillation: Secondary | ICD-10-CM | POA: Diagnosis not present

## 2023-09-13 ENCOUNTER — Ambulatory Visit: Payer: Self-pay | Admitting: Cardiology

## 2023-09-13 DIAGNOSIS — G5602 Carpal tunnel syndrome, left upper limb: Secondary | ICD-10-CM | POA: Diagnosis not present

## 2023-09-13 LAB — CUP PACEART REMOTE DEVICE CHECK
Battery Remaining Longevity: 86 mo
Battery Remaining Percentage: 77 %
Battery Voltage: 3.01 V
Brady Statistic AP VP Percent: 82 %
Brady Statistic AP VS Percent: 1 %
Brady Statistic AS VP Percent: 18 %
Brady Statistic AS VS Percent: 1 %
Brady Statistic RA Percent Paced: 81 %
Brady Statistic RV Percent Paced: 99 %
Date Time Interrogation Session: 20250902020014
Implantable Lead Connection Status: 753985
Implantable Lead Connection Status: 753985
Implantable Lead Implant Date: 20230602
Implantable Lead Implant Date: 20230602
Implantable Lead Location: 753859
Implantable Lead Location: 753860
Implantable Pulse Generator Implant Date: 20230602
Lead Channel Impedance Value: 430 Ohm
Lead Channel Impedance Value: 450 Ohm
Lead Channel Pacing Threshold Amplitude: 0.625 V
Lead Channel Pacing Threshold Amplitude: 0.875 V
Lead Channel Pacing Threshold Pulse Width: 0.5 ms
Lead Channel Pacing Threshold Pulse Width: 0.5 ms
Lead Channel Sensing Intrinsic Amplitude: 12 mV
Lead Channel Sensing Intrinsic Amplitude: 3 mV
Lead Channel Setting Pacing Amplitude: 1.125
Lead Channel Setting Pacing Amplitude: 1.625
Lead Channel Setting Pacing Pulse Width: 0.5 ms
Lead Channel Setting Sensing Sensitivity: 2 mV
Pulse Gen Model: 2272
Pulse Gen Serial Number: 8084505

## 2023-09-18 DIAGNOSIS — G5602 Carpal tunnel syndrome, left upper limb: Secondary | ICD-10-CM | POA: Diagnosis not present

## 2023-09-18 NOTE — Progress Notes (Signed)
 Remote PPM Transmission

## 2023-09-25 DIAGNOSIS — G5602 Carpal tunnel syndrome, left upper limb: Secondary | ICD-10-CM | POA: Diagnosis not present

## 2023-09-30 IMAGING — DX DG CHEST 1V PORT
1 series · 1 of 1 positions shown · non-contrast
Comparison: None Available.

CLINICAL DATA: Shortness of breath.

EXAM:
PORTABLE CHEST 1 VIEW

[chest]
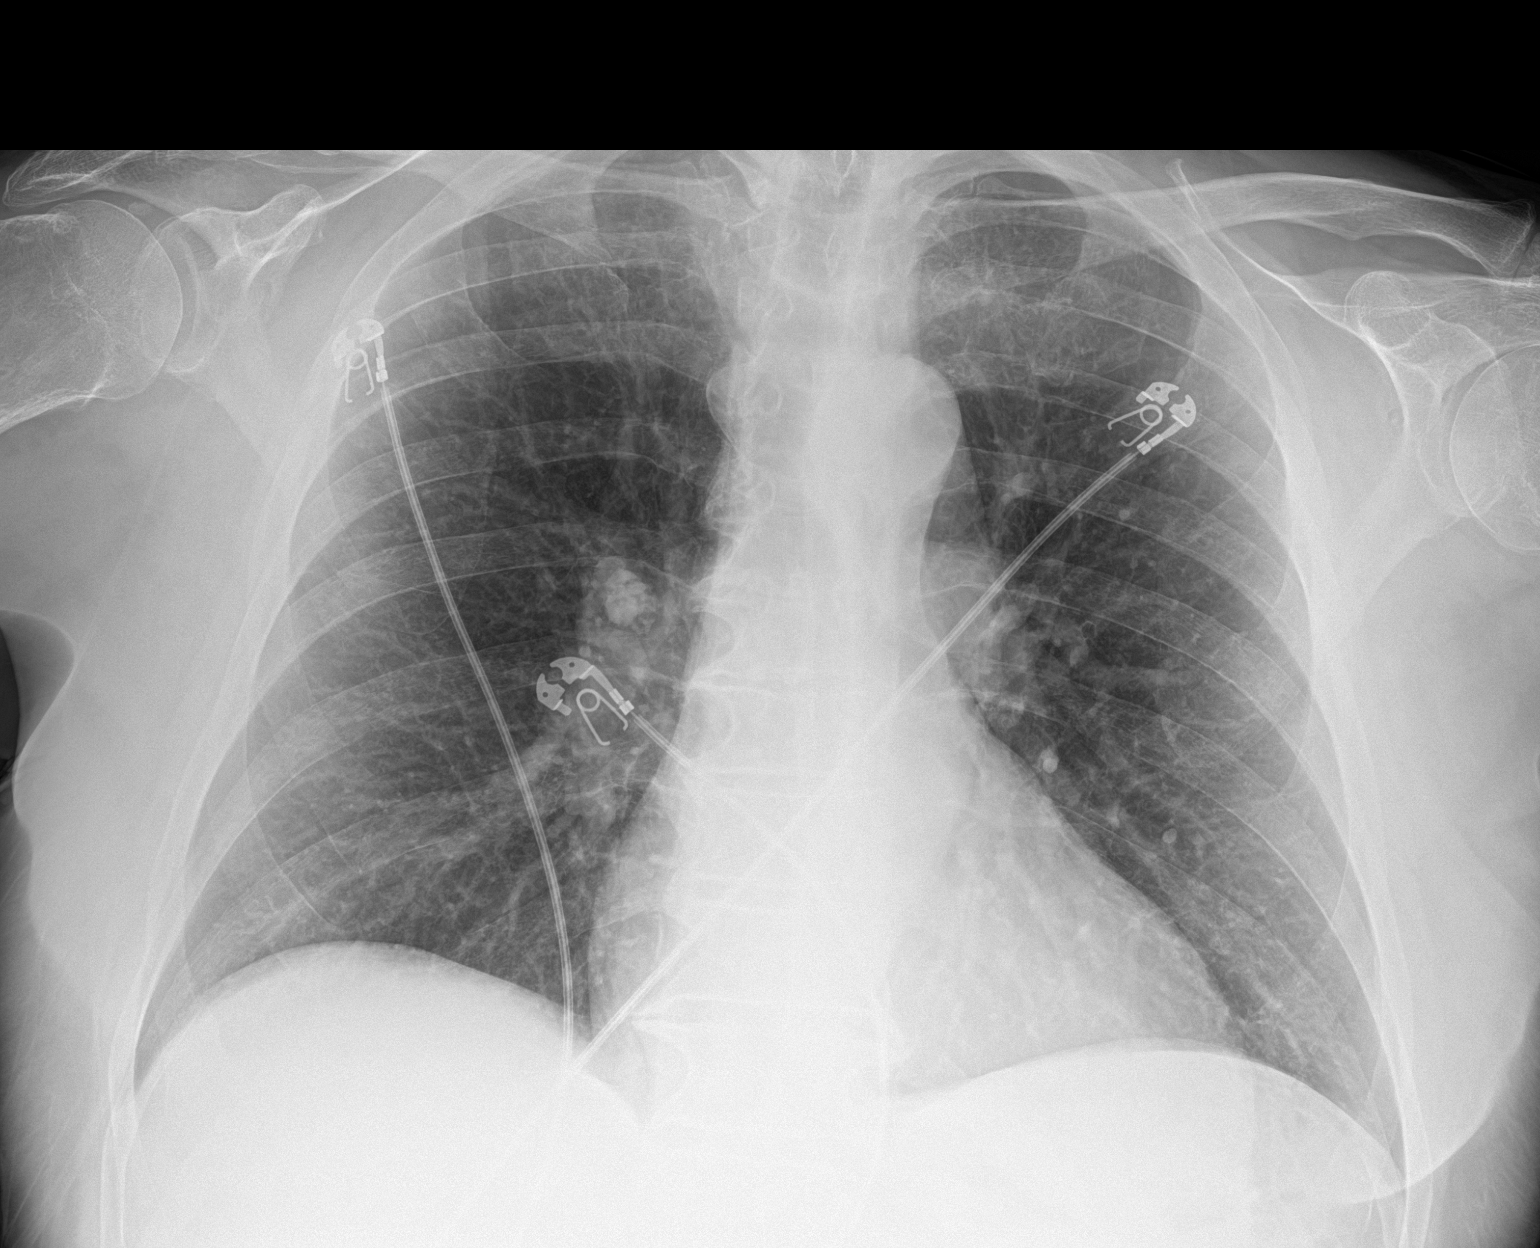

[1 of 1 positions shown; findings below may reference images not displayed]

FINDINGS: Cardiac silhouette and mediastinal contours are within normal
limits. The lungs are clear. No pleural effusion or moderate
multilevel degenerative disc space narrowing and peripheral
osteophytes of the thoracic spine. No acute skeletal abnormality.
IMPRESSION: No active disease.

## 2023-10-05 DIAGNOSIS — G5602 Carpal tunnel syndrome, left upper limb: Secondary | ICD-10-CM | POA: Diagnosis not present

## 2023-10-18 ENCOUNTER — Other Ambulatory Visit

## 2023-10-18 DIAGNOSIS — Z006 Encounter for examination for normal comparison and control in clinical research program: Secondary | ICD-10-CM

## 2023-10-27 LAB — GENECONNECT MOLECULAR SCREEN: Genetic Analysis Overall Interpretation: NEGATIVE

## 2023-12-11 ENCOUNTER — Ambulatory Visit: Payer: Medicare HMO

## 2023-12-11 DIAGNOSIS — I480 Paroxysmal atrial fibrillation: Secondary | ICD-10-CM

## 2023-12-12 LAB — CUP PACEART REMOTE DEVICE CHECK
Battery Remaining Longevity: 85 mo
Battery Remaining Percentage: 74 %
Battery Voltage: 3.01 V
Brady Statistic AP VP Percent: 67 %
Brady Statistic AP VS Percent: 1 %
Brady Statistic AS VP Percent: 33 %
Brady Statistic AS VS Percent: 1 %
Brady Statistic RA Percent Paced: 67 %
Brady Statistic RV Percent Paced: 99 %
Date Time Interrogation Session: 20251202023033
Implantable Lead Connection Status: 753985
Implantable Lead Connection Status: 753985
Implantable Lead Implant Date: 20230602
Implantable Lead Implant Date: 20230602
Implantable Lead Location: 753859
Implantable Lead Location: 753860
Implantable Pulse Generator Implant Date: 20230602
Lead Channel Impedance Value: 430 Ohm
Lead Channel Impedance Value: 450 Ohm
Lead Channel Pacing Threshold Amplitude: 0.625 V
Lead Channel Pacing Threshold Amplitude: 0.875 V
Lead Channel Pacing Threshold Pulse Width: 0.5 ms
Lead Channel Pacing Threshold Pulse Width: 0.5 ms
Lead Channel Sensing Intrinsic Amplitude: 12 mV
Lead Channel Sensing Intrinsic Amplitude: 2.6 mV
Lead Channel Setting Pacing Amplitude: 1.125
Lead Channel Setting Pacing Amplitude: 1.625
Lead Channel Setting Pacing Pulse Width: 0.5 ms
Lead Channel Setting Sensing Sensitivity: 2 mV
Pulse Gen Model: 2272
Pulse Gen Serial Number: 8084505

## 2023-12-13 ENCOUNTER — Ambulatory Visit: Payer: Self-pay | Admitting: Cardiology

## 2023-12-14 NOTE — Progress Notes (Signed)
 Remote PPM Transmission

## 2023-12-24 DIAGNOSIS — E559 Vitamin D deficiency, unspecified: Secondary | ICD-10-CM | POA: Diagnosis not present
